# Patient Record
Sex: Male | Born: 1965 | Race: Black or African American | Hispanic: No | Marital: Married | Smoking: Never smoker
Health system: Southern US, Community
[De-identification: ages and names within clinical notes are randomized; demographics above are authoritative.]

## PROBLEM LIST (undated history)

## (undated) DIAGNOSIS — E663 Overweight: Secondary | ICD-10-CM

## (undated) DIAGNOSIS — N3289 Other specified disorders of bladder: Secondary | ICD-10-CM

## (undated) DIAGNOSIS — N189 Chronic kidney disease, unspecified: Secondary | ICD-10-CM

## (undated) DIAGNOSIS — E559 Vitamin D deficiency, unspecified: Secondary | ICD-10-CM

## (undated) DIAGNOSIS — Z9889 Other specified postprocedural states: Secondary | ICD-10-CM

## (undated) DIAGNOSIS — K635 Polyp of colon: Secondary | ICD-10-CM

## (undated) DIAGNOSIS — E785 Hyperlipidemia, unspecified: Secondary | ICD-10-CM

## (undated) DIAGNOSIS — F524 Premature ejaculation: Secondary | ICD-10-CM

## (undated) DIAGNOSIS — N39498 Other specified urinary incontinence: Secondary | ICD-10-CM

## (undated) DIAGNOSIS — N403 Nodular prostate with lower urinary tract symptoms: Secondary | ICD-10-CM

## (undated) HISTORY — DX: Nodular prostate with lower urinary tract symptoms: N40.3

## (undated) HISTORY — DX: Chronic kidney disease, unspecified: N18.9

## (undated) HISTORY — DX: Overweight: E66.3

## (undated) HISTORY — DX: Premature ejaculation: F52.4

## (undated) HISTORY — PX: TONSILLECTOMY: SUR1361

## (undated) HISTORY — DX: Other specified disorders of bladder: N32.89

## (undated) HISTORY — DX: Vitamin D deficiency, unspecified: E55.9

## (undated) HISTORY — DX: Hyperlipidemia, unspecified: E78.5

## (undated) HISTORY — DX: Polyp of colon: K63.5

## (undated) HISTORY — DX: Other specified urinary incontinence: N39.498

---

## 2006-01-09 ENCOUNTER — Ambulatory Visit: Payer: Self-pay | Admitting: Family Medicine

## 2007-02-11 DIAGNOSIS — E785 Hyperlipidemia, unspecified: Secondary | ICD-10-CM | POA: Insufficient documentation

## 2007-03-27 ENCOUNTER — Ambulatory Visit: Payer: Self-pay | Admitting: Gastroenterology

## 2008-12-20 HISTORY — PX: OTHER SURGICAL HISTORY: SHX169

## 2009-08-10 DIAGNOSIS — E559 Vitamin D deficiency, unspecified: Secondary | ICD-10-CM

## 2012-09-11 DIAGNOSIS — Z9889 Other specified postprocedural states: Secondary | ICD-10-CM

## 2012-09-11 HISTORY — DX: Other specified postprocedural states: Z98.890

## 2013-03-27 LAB — HM COLONOSCOPY: HM COLON: NORMAL

## 2014-02-26 LAB — LIPID PANEL
Cholesterol: 248 mg/dL — AB (ref 0–200)
HDL: 57 mg/dL (ref 35–70)
LDL Cholesterol: 171 mg/dL
Triglycerides: 99 mg/dL (ref 40–160)

## 2014-08-26 ENCOUNTER — Ambulatory Visit: Payer: Self-pay

## 2015-02-13 ENCOUNTER — Encounter: Payer: Self-pay | Admitting: Family Medicine

## 2015-02-18 ENCOUNTER — Other Ambulatory Visit: Payer: Self-pay | Admitting: Family Medicine

## 2015-02-18 MED ORDER — FINASTERIDE 5 MG PO TABS: 5.0000 mg | ORAL_TABLET | Freq: Every day | ORAL | Status: DC

## 2015-02-27 ENCOUNTER — Encounter: Payer: Self-pay | Admitting: Family Medicine

## 2015-02-27 DIAGNOSIS — N138 Other obstructive and reflux uropathy: Secondary | ICD-10-CM | POA: Insufficient documentation

## 2015-02-27 DIAGNOSIS — Z87898 Personal history of other specified conditions: Secondary | ICD-10-CM | POA: Insufficient documentation

## 2015-02-27 DIAGNOSIS — N401 Enlarged prostate with lower urinary tract symptoms: Secondary | ICD-10-CM

## 2015-02-27 DIAGNOSIS — R600 Localized edema: Secondary | ICD-10-CM | POA: Insufficient documentation

## 2015-03-01 ENCOUNTER — Ambulatory Visit (INDEPENDENT_AMBULATORY_CARE_PROVIDER_SITE_OTHER): Payer: Managed Care, Other (non HMO) | Admitting: Family Medicine

## 2015-03-01 ENCOUNTER — Encounter: Payer: Self-pay | Admitting: Family Medicine

## 2015-03-01 VITALS — BP 114/82 | HR 89 | Temp 98.3°F | Resp 18 | Ht 71.0 in | Wt 199.7 lb

## 2015-03-01 DIAGNOSIS — Z13220 Encounter for screening for lipoid disorders: Secondary | ICD-10-CM | POA: Diagnosis not present

## 2015-03-01 DIAGNOSIS — Z0000 Encounter for general adult medical examination without abnormal findings: Secondary | ICD-10-CM

## 2015-03-01 DIAGNOSIS — N401 Enlarged prostate with lower urinary tract symptoms: Secondary | ICD-10-CM

## 2015-03-01 DIAGNOSIS — N138 Other obstructive and reflux uropathy: Secondary | ICD-10-CM

## 2015-03-01 DIAGNOSIS — Z860100 Personal history of colon polyps, unspecified: Secondary | ICD-10-CM

## 2015-03-01 NOTE — Progress Notes (Signed)
Name: DEBORAH DONDERO   MRN: [id number]    DOB: [birth year]   Date:03/01/2015       Progress Note  Subjective  Chief Complaint  Chief Complaint  Patient presents with  . Annual Exam    HPI  Well Exam: he has not complaints today, his BPH is under control, had a transurethral incision of bladder neck in April 2016 because he had urinary retention in December and had to use self cath daily until the surgery in April . He is doing well now, back exercising, eating healthy.   Patient Active Problem List   Diagnosis Date Noted  . Benign prostatic hyperplasia with urinary obstruction 02/27/2015  . History of urinary retention 02/27/2015  . Vitamin D deficiency 08/10/2009  . Dyslipidemia 02/11/2007    Past Surgical History  Procedure Laterality Date  . Tonsillectomy    . Transurethral incision of bladder neck Bilateral 12/20/2008    Family History  Problem Relation Age of Onset  . Hypertension Mother   . Hypertension Father     History   Social History  . Marital Status: Married    Spouse Name: N/A  . Number of Children: N/A  . Years of Education: N/A   Occupational History  . Not on file.   Social History Main Topics  . Smoking status: Never Smoker   . Smokeless tobacco: Not on file  . Alcohol Use: No  . Drug Use: No  . Sexual Activity:    Partners: Female   Other Topics Concern  . Not on file   Social History Narrative     Current outpatient prescriptions:  .  Cholecalciferol (VITAMIN D) 2000 UNITS tablet, Take by mouth., Disp: , Rfl:  .  finasteride (PROSCAR) 5 MG tablet, Take by mouth., Disp: , Rfl:  .  tamsulosin (FLOMAX) 0.4 MG CAPS capsule, Take by mouth., Disp: , Rfl:   No Known Allergies   ROS  Constitutional: Negative for fever or weight change.  Respiratory: Negative for cough and shortness of breath.   Cardiovascular: Negative for chest pain or palpitations.  Gastrointestinal: Negative for abdominal pain, no bowel changes.  Musculoskeletal:  Negative for gait problem or joint swelling.  Skin: Negative for rash.  Neurological: Negative for dizziness or headache.  No other specific complaints in a complete review of systems (except as listed in HPI above).   Objective  Filed Vitals:   03/01/15 0844  BP: 114/82  Pulse: 89  Temp: 98.3 F (36.8 C)  TempSrc: Oral  Resp: 18  Height:  (1.803 m)  Weight: 199 lb 11.2 oz (90.583 kg)  SpO2: 97%    Body mass index is 27.86 kg/(m^2).  Physical Exam  Constitutional: Patient appears well-developed and well-nourished. No distress.  HENT: Head: Normocephalic and atraumatic. Ears: B TMs ok, no erythema or effusion; Nose: Nose normal. Mouth/Throat: Oropharynx is clear and moist. No oropharyngeal exudate.  Eyes: Conjunctivae and EOM are normal. Pupils are equal, round, and reactive to light. No scleral icterus.  Neck: Normal range of motion. Neck supple. No JVD present. No thyromegaly present.  Cardiovascular: Normal rate, regular rhythm and normal heart sounds.  No murmur heard. No BLE edema. Pulmonary/Chest: Effort normal and breath sounds normal. No respiratory distress. Abdominal: Soft. Bowel sounds are normal, no distension. There is no tenderness. no masses MALE GENITALIA: not done, seen by Urologist 2 months ago RECTAL: done by Urologist 2 months ago Musculoskeletal: Normal range of motion, no joint effusions. No gross deformities Neurological: he  is alert and oriented to person, place, and time. No cranial nerve deficit. Coordination, balance, strength, speech and gait are normal.  Skin: Skin is warm and dry. No rash noted. No erythema.  Psychiatric: Patient has a normal mood and affect. behavior is normal. Judgment and thought content normal.  PHQ2/9: Depression screen PHQ 2/9 03/01/2015  Decreased Interest 0  Down, Depressed, Hopeless 0  PHQ - 2 Score 0     Fall Risk: Fall Risk  03/01/2015  Falls in the past year? No    Assessment & Plan  1. Encounter for  routine history and physical exam for male Discussed importance of 150 minutes of physical activity weekly, eat two servings of fish weekly, eat one serving of tree nuts ( cashews, pistachios, pecans, almonds.Marland Kitchen) every other day, eat 6 servings of fruit/vegetables daily and drink plenty of water and avoid sweet beverages. Start aspirin 81 mg when you turn 50yo  2. History of colon polyps Last one 03/27/2013 was normal but due for repeat every 3 years ( previous one in 2011 had polyp) no family history of colon cancer  3. Benign prostatic hyperplasia with urinary obstruction Continue medicaitons  4. Lipid screening

## 2015-03-01 NOTE — Patient Instructions (Signed)
Discussed importance of 150 minutes of physical activity weekly, eat two servings of fish weekly, eat one serving of tree nuts ( cashews, pistachios, pecans, almonds.Marland Kitchen) every other day, eat 6 servings of fruit/vegetables daily and drink plenty of water and avoid sweet beverages. Start aspirin 81 mg when you turn 50yo

## 2015-03-02 ENCOUNTER — Telehealth: Payer: Self-pay

## 2015-03-02 LAB — LIPID PANEL
CHOL/HDL RATIO: 4.4 ratio (ref 0.0–5.0)
Cholesterol, Total: 242 mg/dL — ABNORMAL HIGH (ref 100–199)
HDL: 55 mg/dL (ref >39)
LDL Calculated: 169 mg/dL — ABNORMAL HIGH (ref 0–99)
Triglycerides: 88 mg/dL (ref 0–149)
VLDL CHOLESTEROL CAL: 18 mg/dL (ref 5–40)

## 2015-03-02 NOTE — Telephone Encounter (Signed)
-----   Message from Alba Cory, MD sent at 03/02/2015  8:18 AM EDT ----- Based on the results of lipid panel his cardiovascular risk factor in the next 10 years is 4.2% and he does not need to take statins at this time. He needs to follow a low fat diet and exercise

## 2015-03-02 NOTE — Telephone Encounter (Signed)
Left vm for patient to return my call. °

## 2015-08-26 ENCOUNTER — Encounter: Payer: Self-pay | Admitting: Family Medicine

## 2015-08-27 ENCOUNTER — Other Ambulatory Visit: Payer: Self-pay

## 2015-08-27 MED ORDER — TAMSULOSIN HCL 0.4 MG PO CAPS: 0.4000 mg | ORAL_CAPSULE | Freq: Every day | ORAL | Status: DC

## 2015-11-28 ENCOUNTER — Other Ambulatory Visit: Payer: Self-pay | Admitting: Family Medicine

## 2016-03-01 ENCOUNTER — Ambulatory Visit (INDEPENDENT_AMBULATORY_CARE_PROVIDER_SITE_OTHER): Payer: Managed Care, Other (non HMO) | Admitting: Family Medicine

## 2016-03-01 ENCOUNTER — Encounter: Payer: Self-pay | Admitting: Family Medicine

## 2016-03-01 VITALS — BP 118/74 | HR 78 | Temp 98.0°F | Resp 18 | Ht 71.0 in | Wt 174.9 lb

## 2016-03-01 DIAGNOSIS — Z0000 Encounter for general adult medical examination without abnormal findings: Secondary | ICD-10-CM

## 2016-03-01 DIAGNOSIS — R634 Abnormal weight loss: Secondary | ICD-10-CM | POA: Diagnosis not present

## 2016-03-01 DIAGNOSIS — Z1211 Encounter for screening for malignant neoplasm of colon: Secondary | ICD-10-CM

## 2016-03-01 DIAGNOSIS — E559 Vitamin D deficiency, unspecified: Secondary | ICD-10-CM

## 2016-03-01 DIAGNOSIS — Z13220 Encounter for screening for lipoid disorders: Secondary | ICD-10-CM | POA: Diagnosis not present

## 2016-03-01 DIAGNOSIS — N401 Enlarged prostate with lower urinary tract symptoms: Secondary | ICD-10-CM

## 2016-03-01 DIAGNOSIS — Z86010 Personal history of colonic polyps: Secondary | ICD-10-CM | POA: Diagnosis not present

## 2016-03-01 DIAGNOSIS — N138 Other obstructive and reflux uropathy: Secondary | ICD-10-CM

## 2016-03-01 DIAGNOSIS — Z9289 Personal history of other medical treatment: Secondary | ICD-10-CM

## 2016-03-01 DIAGNOSIS — Z131 Encounter for screening for diabetes mellitus: Secondary | ICD-10-CM

## 2016-03-01 DIAGNOSIS — Z79899 Other long term (current) drug therapy: Secondary | ICD-10-CM

## 2016-03-01 LAB — COMPREHENSIVE METABOLIC PANEL
ALBUMIN: 3.9 g/dL (ref 3.6–5.1)
ALT: 16 U/L (ref 9–46)
AST: 16 U/L (ref 10–35)
Alkaline Phosphatase: 67 U/L (ref 40–115)
BUN: 24 mg/dL (ref 7–25)
CHLORIDE: 103 mmol/L (ref 98–110)
CO2: 28 mmol/L (ref 20–31)
CREATININE: 1.45 mg/dL — AB (ref 0.70–1.33)
Calcium: 9.5 mg/dL (ref 8.6–10.3)
Glucose, Bld: 92 mg/dL (ref 65–99)
POTASSIUM: 4.3 mmol/L (ref 3.5–5.3)
SODIUM: 139 mmol/L (ref 135–146)
TOTAL PROTEIN: 7.1 g/dL (ref 6.1–8.1)
Total Bilirubin: 0.5 mg/dL (ref 0.2–1.2)

## 2016-03-01 LAB — LIPID PANEL
CHOL/HDL RATIO: 3.3 ratio (ref <=5.0)
Cholesterol: 191 mg/dL (ref 125–200)
HDL: 58 mg/dL (ref >=40)
LDL CALC: 123 mg/dL (ref <130)
Triglycerides: 49 mg/dL (ref <150)
VLDL: 10 mg/dL (ref <30)

## 2016-03-01 LAB — TSH: TSH: 1.17 m[IU]/L (ref 0.40–4.50)

## 2016-03-01 MED ORDER — TAMSULOSIN HCL 0.4 MG PO CAPS: 0.4000 mg | ORAL_CAPSULE | Freq: Every day | ORAL | Status: DC

## 2016-03-01 MED ORDER — ASPIRIN EC 81 MG PO TBEC: 81.0000 mg | DELAYED_RELEASE_TABLET | Freq: Every day | ORAL | Status: DC

## 2016-03-01 MED ORDER — FINASTERIDE 5 MG PO TABS: 5.0000 mg | ORAL_TABLET | Freq: Every day | ORAL | Status: DC

## 2016-03-01 NOTE — Progress Notes (Addendum)
Name: Mitchell Doyle   MRN: [id number]    DOB: 1966/06/02   Date:03/01/2016       Progress Note  Subjective  Chief Complaint  Chief Complaint  Patient presents with  . Annual Exam    HPI  Well Male Exam: he has lost 26 lbs since last year, he has not been drinking sweet beverages for the past 10 years, and no sweets for the past one year, he is also exercising 3-4 times weekly. Cardiovascular and weights. Feeling well, he denies fatigue, no episodes of urinary retention since last Spring. He states that he has been stable for past 4-5 months  BPH with history of urinary retention: last year he had severe urinary retention, went to [facility] had a catheter placed for 4 months, and after he saw Dr. Lennie Odor he had a transurethral incision of bladder neck 12/21/2014, and he has been on Proscar and Flomax and is doing well.   Weight loss: he has radically changed his diet and his life style, but has noticed some cold intolerance, weight has been stable over the past 4-6 months. We will check labs, but he does not want to have HIV checked   Patient Active Problem List   Diagnosis Date Noted  . Benign prostatic hyperplasia with urinary obstruction 02/27/2015  . History of urinary retention 02/27/2015  . Vitamin D deficiency 08/10/2009  . Dyslipidemia 02/11/2007    Past Surgical History  Procedure Laterality Date  . Tonsillectomy    . Transurethral incision of bladder neck Bilateral 12/20/2008    Family History  Problem Relation Age of Onset  . Hypertension Mother   . Hypertension Father     Social History   Social History  . Marital Status: Married    Spouse Name: N/A  . Number of Children: N/A  . Years of Education: N/A   Occupational History  . Not on file.   Social History Main Topics  . Smoking status: Never Smoker   . Smokeless tobacco: Never Used  . Alcohol Use: No  . Drug Use: No  . Sexual Activity:    Partners: Female   Other Topics Concern  . Not on file    Social History Narrative     Current outpatient prescriptions:  .  Cholecalciferol (VITAMIN D) 2000 UNITS tablet, Take by mouth., Disp: , Rfl:  .  finasteride (PROSCAR) 5 MG tablet, Take 1 tablet (5 mg total) by mouth daily., Disp: 90 tablet, Rfl: 3 .  tamsulosin (FLOMAX) 0.4 MG CAPS capsule, Take 1 capsule (0.4 mg total) by mouth daily., Disp: 90 capsule, Rfl: 3 .  Vitamin D, Ergocalciferol, (DRISDOL) 50000 units CAPS capsule, Take by mouth., Disp: , Rfl:  .  aspirin EC 81 MG tablet, Take 1 tablet (81 mg total) by mouth daily., Disp: 30 tablet, Rfl: 0  No Known Allergies   ROS  Constitutional: Negative for fever , positive for  weight change.  Respiratory: Negative for cough and shortness of breath.   Cardiovascular: Negative for chest pain or palpitations.  Gastrointestinal: Negative for abdominal pain, no bowel changes.  Musculoskeletal: Negative for gait problem or joint swelling.  Skin: Negative for rash.  Neurological: Negative for dizziness or headache.  No other specific complaints in a complete review of systems (except as listed in HPI above).  Objective  Filed Vitals:   03/01/16 0835  BP: 118/74  Pulse: 78  Temp: 98 F (36.7 C)  TempSrc: Oral  Resp: 18  Height:  (1.803 m)  Weight: 174 lb 14.4 oz (79.334 kg)  SpO2: 95%    Body mass index is 24.4 kg/(m^2).  Physical Exam  Constitutional: Patient appears well-developed and well-nourished. No distress.  HENT: Head: Normocephalic and atraumatic. Ears: B TMs ok, no erythema or effusion; Nose: Nose normal. Mouth/Throat: Oropharynx is clear and moist. No oropharyngeal exudate.  Eyes: Conjunctivae and EOM are normal. Pupils are equal, round, and reactive to light. No scleral icterus.  Neck: Normal range of motion. Neck supple. No JVD present. No thyromegaly present.  Cardiovascular: Normal rate, regular rhythm and normal heart sounds.  No murmur heard. No BLE edema. Pulmonary/Chest: Effort normal and  breath sounds normal. No respiratory distress. Abdominal: Soft. Bowel sounds are normal, no distension. There is no tenderness. no masses MALE GENITALIA: Normal descended testes bilaterally, no masses palpated, no hernias, no lesions, no discharge RECTAL: Prostate enlarged, no masses, no rectal masses or hemorrhoids Musculoskeletal: Normal range of motion, no joint effusions. No gross deformities Neurological: he is alert and oriented to person, place, and time. No cranial nerve deficit. Coordination, balance, strength, speech and gait are normal.  Skin: Skin is warm and dry. No rash noted. No erythema.  Psychiatric: Patient has a normal mood and affect. behavior is normal. Judgment and thought content normal.   PHQ2/9: Depression screen [facility] 2/9 03/01/2016 03/01/2015  Decreased Interest 0 0  Down, Depressed, Hopeless 0 0  PHQ - 2 Score 0 0    Fall Risk: Fall Risk  03/01/2016 03/01/2015  Falls in the past year? No No    Functional Status Survey: Is the patient deaf or have difficulty hearing?: No Does the patient have difficulty seeing, even when wearing glasses/contacts?: No Does the patient have difficulty concentrating, remembering, or making decisions?: No Does the patient have difficulty walking or climbing stairs?: No Does the patient have difficulty dressing or bathing?: No Does the patient have difficulty doing errands alone such as visiting a doctor's office or shopping?: No  IPSS Questionnaire (AUA-7): Over the past month.   1)  How often have you had a sensation of not emptying your bladder completely after you finish urinating?  0 - Not at all  2)  How often have you had to urinate again less than two hours after you finished urinating? 0 - Not at all  3)  How often have you found you stopped and started again several times when you urinated?  0 - Not at all  4) How difficult have you found it to postpone urination?  0 - Not at all  5) How often have you had a weak urinary  stream?  0 - Not at all  6) How often have you had to push or strain to begin urination?  0 - Not at all  7) How many times did you most typically get up to urinate from the time you went to bed until the time you got up in the morning?  1 - 1 time  Total score:  0-7 mildly symptomatic   8-19 moderately symptomatic   20-35 severely symptomatic   Assessment & Plan  1. Encounter for routine history and physical exam for male  Discussed importance of 150 minutes of physical activity weekly, eat two servings of fish weekly, eat one serving of tree nuts ( cashews, pistachios, pecans, almonds.Marland Kitchen.) every other day, eat 6 servings of fruit/vegetables daily and drink plenty of water and avoid sweet beverages. Start aspirin 81 mg daily   2. History of colon polyps  History of polyps in the past  3. Benign prostatic hyperplasia with urinary obstruction  - tamsulosin (FLOMAX) 0.4 MG CAPS capsule; Take 1 capsule (0.4 mg total) by mouth daily.  Dispense: 90 capsule; Refill: 3 - finasteride (PROSCAR) 5 MG tablet; Take 1 tablet (5 mg total) by mouth daily.  Dispense: 90 tablet; Refill: 3 - PSA  4. Lipid screening  - Lipid panel  5. Vitamin D deficiency  - VITAMIN D 25 Hydroxy (Vit-D Deficiency, Fractures)  6. Encounter for screening examination for impaired glucose regulation and diabetes mellitus  - Hemoglobin A1c  7. Hx of long-term treatment with high-risk medication  - Comprehensive metabolic panel  8. Colon cancer screening  - Ambulatory referral to Gastroenterology

## 2016-03-01 NOTE — Addendum Note (Signed)
Addended by: Alba CorySOWLES, Lillan Mccreadie F on: 03/01/2016 09:10 AM   Modules accepted: Orders, SmartSet

## 2016-03-02 LAB — PSA: PSA: 0.82 ng/mL (ref <=4.00)

## 2016-03-02 LAB — CBC WITH DIFFERENTIAL/PLATELET
BASOS ABS: 83 {cells}/uL (ref 0–200)
BASOS PCT: 1 %
EOS PCT: 2 %
Eosinophils Absolute: 166 cells/uL (ref 15–500)
HCT: 41.8 % (ref 38.5–50.0)
HEMOGLOBIN: 13.4 g/dL (ref 13.2–17.1)
LYMPHS ABS: 2158 {cells}/uL (ref 850–3900)
Lymphocytes Relative: 26 %
MCH: 27 pg (ref 27.0–33.0)
MCHC: 32.1 g/dL (ref 32.0–36.0)
MCV: 84.1 fL (ref 80.0–100.0)
MONOS PCT: 6 %
MPV: 9.9 fL (ref 7.5–12.5)
Monocytes Absolute: 498 cells/uL (ref 200–950)
NEUTROS ABS: 5395 {cells}/uL (ref 1500–7800)
Neutrophils Relative %: 65 %
PLATELETS: 333 10*3/uL (ref 140–400)
RBC: 4.97 MIL/uL (ref 4.20–5.80)
RDW: 15.7 % — ABNORMAL HIGH (ref 11.0–15.0)
WBC: 8.3 10*3/uL (ref 3.8–10.8)

## 2016-03-02 LAB — HEMOGLOBIN A1C
HEMOGLOBIN A1C: 5.3 % (ref <5.7)
MEAN PLASMA GLUCOSE: 105 mg/dL

## 2016-03-02 LAB — VITAMIN D 25 HYDROXY (VIT D DEFICIENCY, FRACTURES): Vit D, 25-Hydroxy: 52 ng/mL (ref 30–100)

## 2016-03-08 ENCOUNTER — Other Ambulatory Visit: Payer: Self-pay

## 2016-08-31 ENCOUNTER — Encounter: Payer: Self-pay | Admitting: Family Medicine

## 2016-08-31 ENCOUNTER — Ambulatory Visit (INDEPENDENT_AMBULATORY_CARE_PROVIDER_SITE_OTHER): Payer: Managed Care, Other (non HMO) | Admitting: Family Medicine

## 2016-08-31 VITALS — BP 120/84 | HR 102 | Temp 99.0°F | Resp 18 | Ht 71.0 in | Wt 173.2 lb

## 2016-08-31 DIAGNOSIS — R05 Cough: Secondary | ICD-10-CM | POA: Diagnosis not present

## 2016-08-31 DIAGNOSIS — R059 Cough, unspecified: Secondary | ICD-10-CM

## 2016-08-31 DIAGNOSIS — Z23 Encounter for immunization: Secondary | ICD-10-CM

## 2016-08-31 MED ORDER — FLUTICASONE FUROATE-VILANTEROL 100-25 MCG/INH IN AEPB: 1.0000 | INHALATION_SPRAY | Freq: Every day | RESPIRATORY_TRACT | 0 refills | Status: DC

## 2016-08-31 MED ORDER — FLUTICASONE PROPIONATE 50 MCG/ACT NA SUSP: 2.0000 | Freq: Every day | NASAL | 0 refills | Status: DC

## 2016-08-31 MED ORDER — AZITHROMYCIN 250 MG PO TABS: ORAL_TABLET | ORAL | 0 refills | Status: DC

## 2016-08-31 MED ORDER — LORATADINE 10 MG PO TABS: 10.0000 mg | ORAL_TABLET | Freq: Every day | ORAL | 0 refills | Status: DC

## 2016-08-31 NOTE — Progress Notes (Signed)
Name: Mitchell Doyle   MRN: [id number]    DOB: Aug 12, 1966   Date:08/31/2016       Progress Note  Subjective  Chief Complaint  Chief Complaint  Patient presents with  . Cough    Dry Cough for the past month, has tried several otc remedies with no success. Has tried Theraflu, halls, cough drops. Patient states it is worst when talking alot and laying down.     HPI  URI: he [place] Started with rhinorrhea, congestion, muscle aches, followed by a cough about one week after initial symptoms. He has tried multiple otc mediations. He is getting tired of coughing. Worse at night. No wheezing or SOB. He is not a smoker and he does not have a history of asthma or allergies. He continues to have clear rhinorrhea. No fever or chills. Appetite was decreased but is improving now.    Patient Active Problem List   Diagnosis Date Noted  . Benign prostatic hyperplasia with urinary obstruction 02/27/2015  . History of urinary retention 02/27/2015  . Vitamin D deficiency 08/10/2009  . Dyslipidemia 02/11/2007    Past Surgical History:  Procedure Laterality Date  . TONSILLECTOMY    . transurethral incision of bladder neck Bilateral 12/20/2008    Family History  Problem Relation Age of Onset  . Hypertension Mother   . Hypertension Father     Social History   Social History  . Marital status: Married    Spouse name: N/A  . Number of children: N/A  . Years of education: N/A   Occupational History  . Not on file.   Social History Main Topics  . Smoking status: Never Smoker  . Smokeless tobacco: Never Used  . Alcohol use No  . Drug use: No  . Sexual activity: Yes    Partners: Female   Other Topics Concern  . Not on file   Social History Narrative  . No narrative on file     Current Outpatient Prescriptions:  .  Cholecalciferol (VITAMIN D) 2000 UNITS tablet, Take by mouth., Disp: , Rfl:  .  finasteride (PROSCAR) 5  MG tablet, Take 1 tablet (5 mg total) by mouth daily., Disp: 90 tablet, Rfl: 3 .  tamsulosin (FLOMAX) 0.4 MG CAPS capsule, Take 1 capsule (0.4 mg total) by mouth daily., Disp: 90 capsule, Rfl: 3 .  aspirin EC 81 MG tablet, Take 1 tablet (81 mg total) by mouth daily. (Patient not taking: Reported on 08/31/2016), Disp: 30 tablet, Rfl: 0 .  azithromycin (ZITHROMAX) 250 MG tablet, Take as directed 2 first day and one daily after that, Disp: 6 each, Rfl: 0 .  fluticasone (FLONASE) 50 MCG/ACT nasal spray, Place 2 sprays into both nostrils daily., Disp: 16 g, Rfl: 0 .  fluticasone furoate-vilanterol (BREO ELLIPTA) 100-25 MCG/INH AEPB, Inhale 1 puff into the lungs daily., Disp: 60 each, Rfl: 0 .  loratadine (CLARITIN) 10 MG tablet, Take 1 tablet (10 mg total) by mouth daily., Disp: 30 tablet, Rfl: 0 .  Vitamin D, Ergocalciferol, (DRISDOL) 50000 units CAPS capsule, Take by mouth., Disp: , Rfl:   No Known Allergies   ROS  Constitutional: Negative for fever or weight change.  Respiratory: Positive  for cough no  shortness of breath.   Cardiovascular: Negative for chest pain or palpitations.  Gastrointestinal: Negative for abdominal pain, no bowel changes.  Musculoskeletal: Negative for gait problem or joint swelling.  Skin: Negative for rash.  Neurological: Negative for dizziness or headache.  No other specific complaints in a complete review of systems (except as listed in HPI above).   Objective  Vitals:   08/31/16 1209  BP: 120/84  Pulse: (!) 102  Resp: 18  Temp: 99 F (37.2 C)  TempSrc: Oral  SpO2: 96%  Weight: 173 lb 3.2 oz (78.6 kg)  Height: 5\' 11"  (1.803 m)    Body mass index is 24.16 kg/m.  Physical Exam  Constitutional: Patient appears well-developed and well-nourished. No distress.  HEENT: head atraumatic, normocephalic, pupils equal and reactive to light, neck supple, throat within normal limits Cardiovascular: Normal rate, regular rhythm and normal heart sounds.  No  murmur heard. No BLE edema. Pulmonary/Chest: Effort normal and breath sounds normal. No respiratory distress. Abdominal: Soft.  There is no tenderness. Psychiatric: Patient has a normal mood and affect. behavior is normal. Judgment and thought content normal.  PHQ2/9: [facility] 08/31/2016 03/01/2016 03/01/2015  Decreased Interest 0 0 0  Down, Depressed, Hopeless 0 0 0  PHQ - 2 Score 0 0 0    Fall Risk: Fall Risk  08/31/2016 03/01/2016 03/01/2015  Falls in the past year? No No No    Functional Status Survey: Is the patient deaf or have difficulty hearing?: No Does the patient have difficulty seeing, even when wearing glasses/contacts?: No Does the patient have difficulty concentrating, remembering, or making decisions?: No Does the patient have difficulty walking or climbing stairs?: No Does the patient have difficulty dressing or bathing?: No Does the patient have difficulty doing errands alone such as visiting a doctor's office or shopping?: No    Assessment & Plan  1. Cough  Discussed it may be post-bronchial cough versus allergies, however because of recent travel we will also treat for whooping cough.  - azithromycin (ZITHROMAX) 250 MG tablet; Take as directed 2 first day and one daily after that  Dispense: 6 each; Refill: 0 - fluticasone furoate-vilanterol (BREO ELLIPTA) 100-25 MCG/INH AEPB; Inhale 1 puff into the lungs daily.  Dispense: 60 each; Refill: 0 - loratadine (CLARITIN) 10 MG tablet; Take 1 tablet (10 mg total) by mouth daily.  Dispense: 30 tablet; Refill: 0 - fluticasone (FLONASE) 50 MCG/ACT nasal spray; Place 2 sprays into both nostrils daily.  Dispense: 16 g; Refill: 0  2. Needs flu shot  refused

## 2016-11-10 IMAGING — US US RENAL KIDNEY
1 series · 14 of 25 positions shown · non-contrast
Comparison: None.

CLINICAL DATA: BPH, urinary retention

EXAM:
RENAL/URINARY TRACT ULTRASOUND COMPLETE

[Series 1: us renal kidney · 0.28mm/px · 14 of 35 slices shown]
[im 1/35]
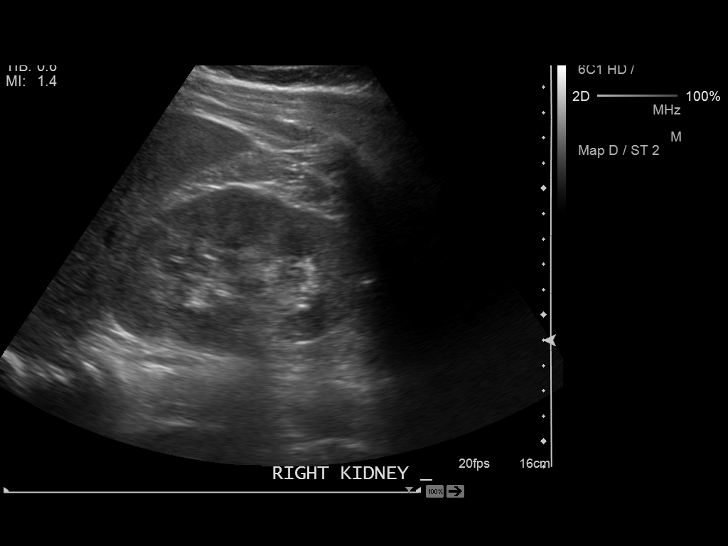
[im 3/35]
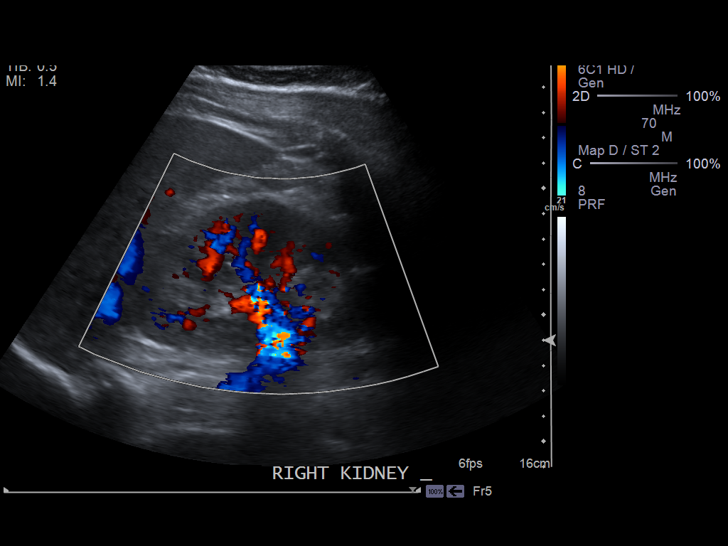
[im 6/35]
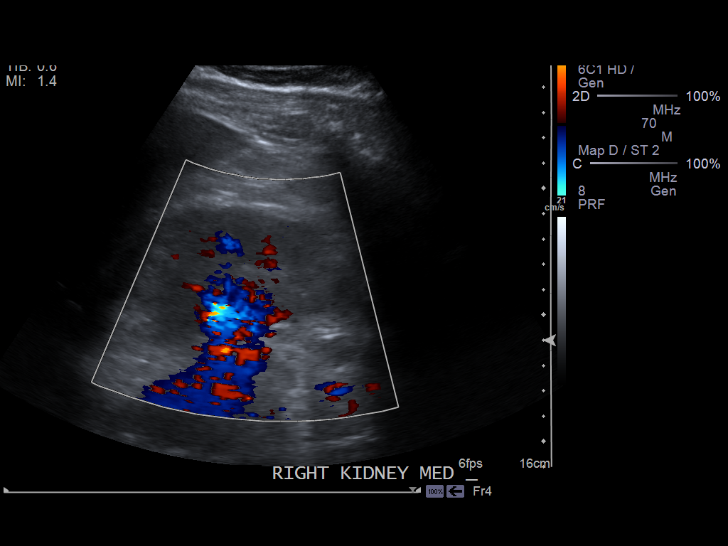
[im 9/35]
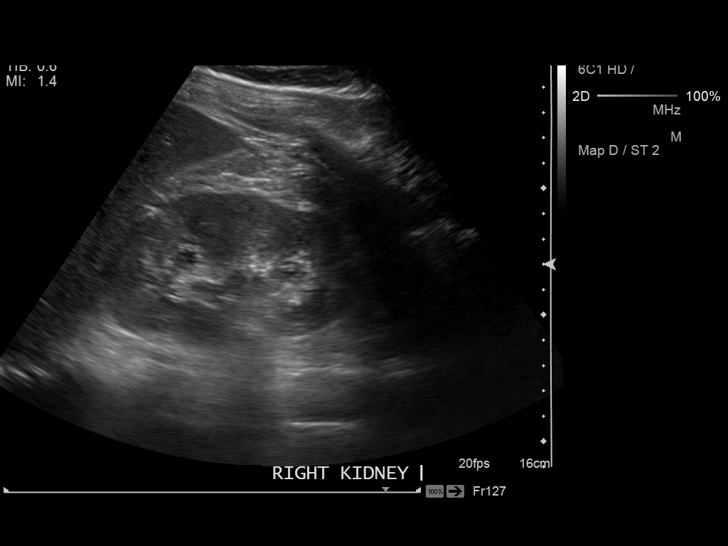
[im 12/35]
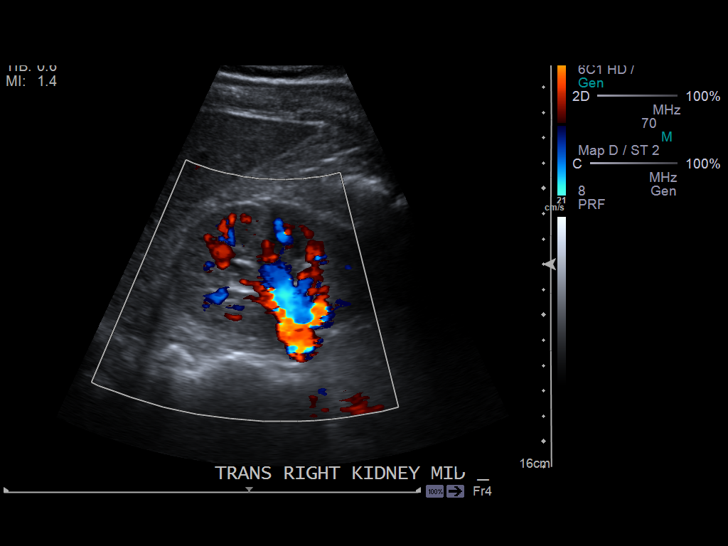
[im 13/35]
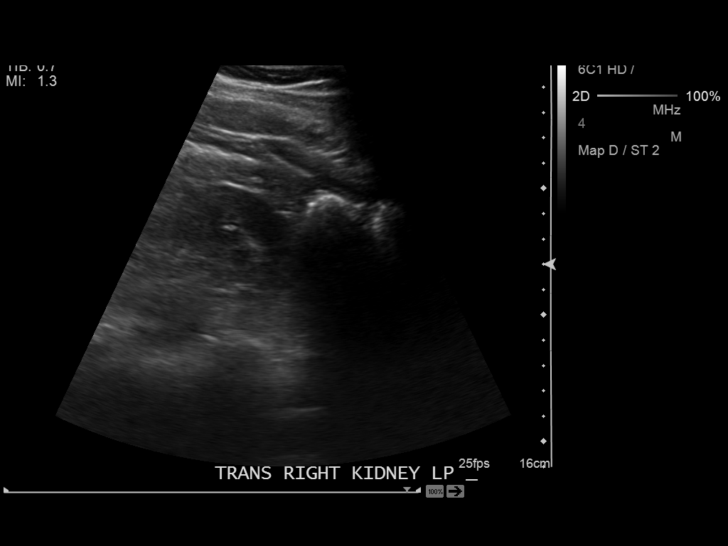
[im 16/35]
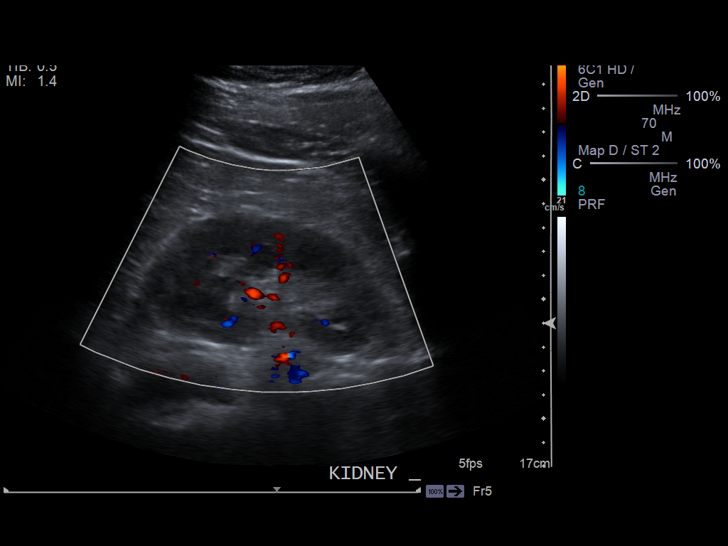
[im 19/35]
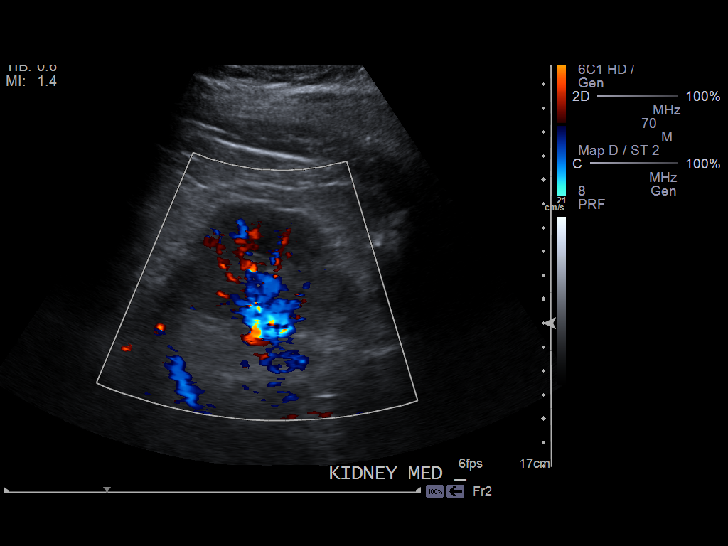
[im 22/35]
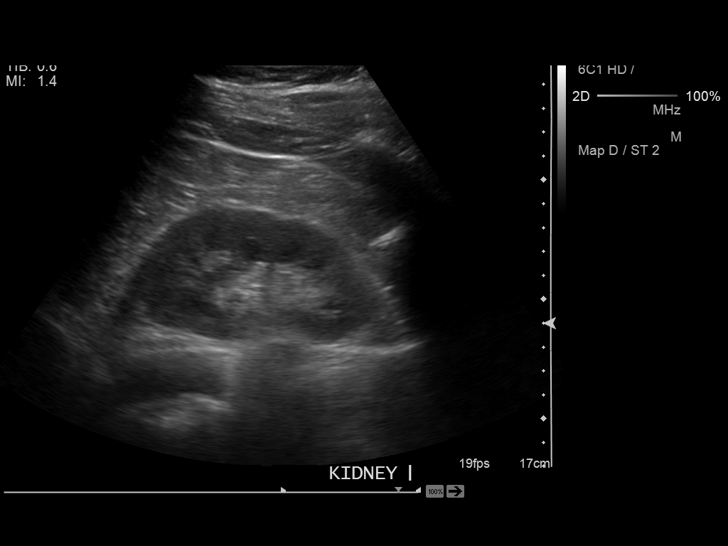
[im 23/35]
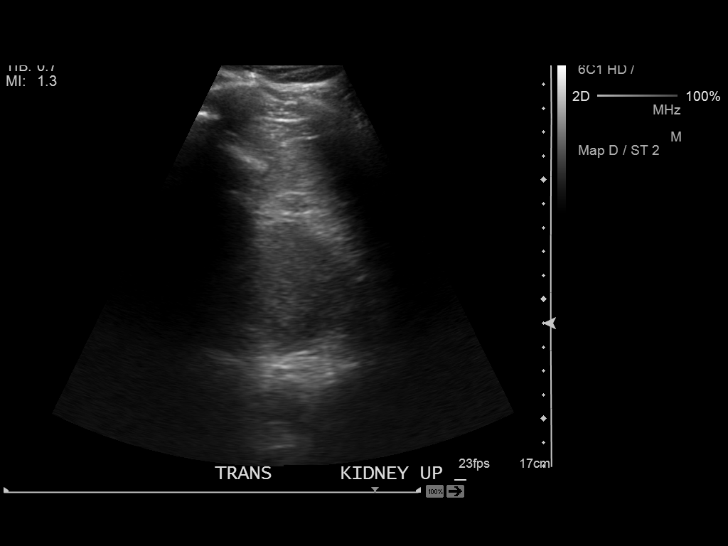
[im 26/35]
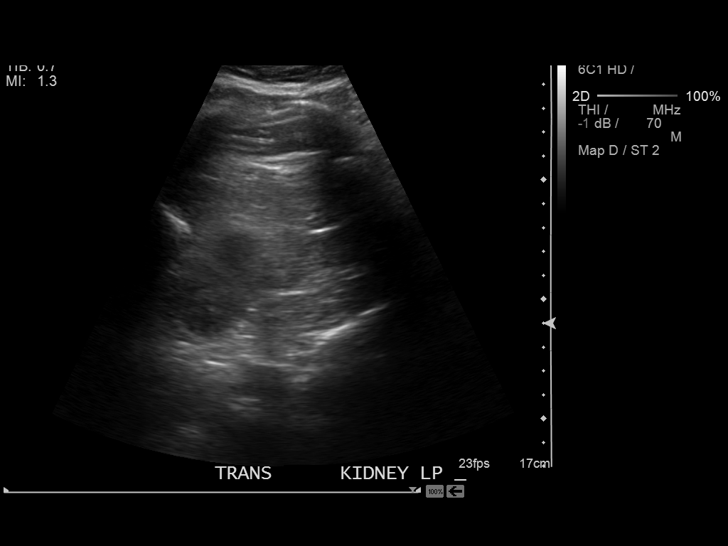
[im 29/35]
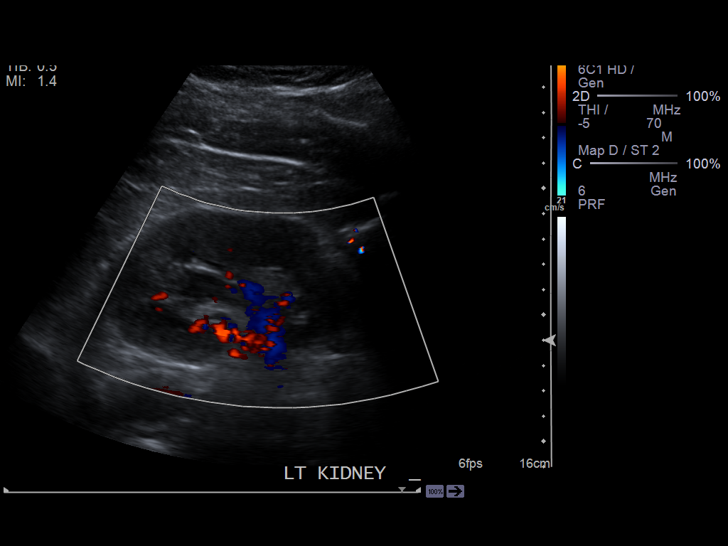
[im 32/35]
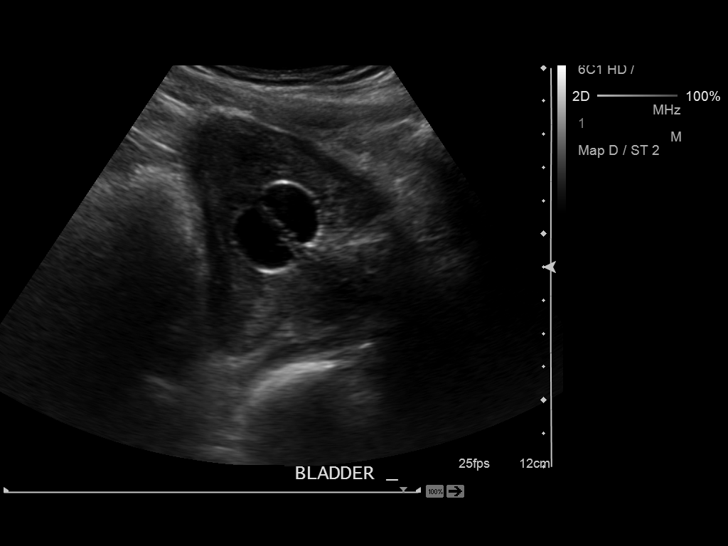
[im 35/35]
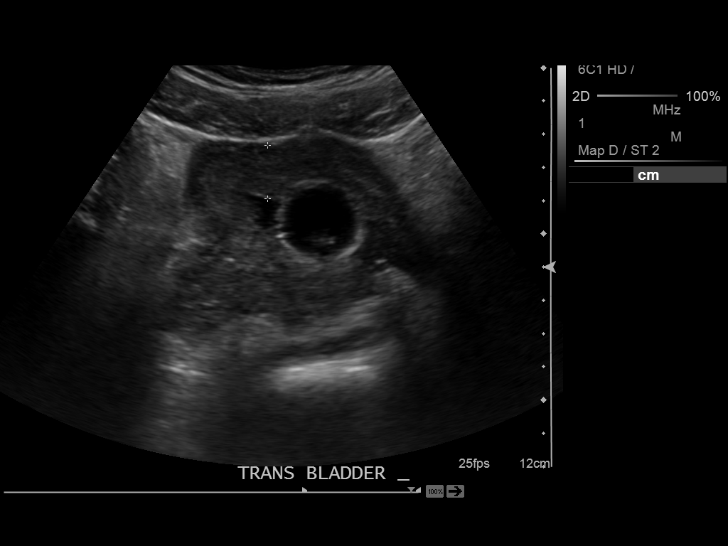

[14 of 25 positions shown; findings below may reference images not displayed]

FINDINGS: Right Kidney:

Length: 9.8 cm. Echogenicity within normal limits. No mass or
hydronephrosis visualized.

Left Kidney:

Length: 9.9 cm. Normal echotexture. Slight pelvicaliectasis. No
mass.

Bladder:

Decompressed with Foley catheter in place. Bladder wall appears
thickened measuring 16 mm, but difficult to evaluate due to
nondistention.
IMPRESSION: Slight left pelvicaliectasis.

Decompressed bladder. Bladder wall appears thickened but difficult
to evaluate.

## 2017-01-24 ENCOUNTER — Telehealth: Payer: Self-pay

## 2017-01-24 ENCOUNTER — Telehealth: Payer: Self-pay | Admitting: Gastroenterology

## 2017-01-24 ENCOUNTER — Other Ambulatory Visit: Payer: Self-pay

## 2017-01-24 DIAGNOSIS — Z86010 Personal history of colonic polyps: Secondary | ICD-10-CM

## 2017-01-24 DIAGNOSIS — Z1211 Encounter for screening for malignant neoplasm of colon: Secondary | ICD-10-CM

## 2017-01-24 NOTE — Telephone Encounter (Signed)
colonoscopy

## 2017-01-24 NOTE — Telephone Encounter (Signed)
Call returned lvm to contact office to schedule.

## 2017-01-25 ENCOUNTER — Telehealth: Payer: Self-pay | Admitting: Gastroenterology

## 2017-01-25 NOTE — Telephone Encounter (Signed)
Gastroenterology Pre-Procedure Review  Request Date: 02/02/17 Requesting Physician: [facility]  PATIENT REVIEW QUESTIONS: The patient responded to the following health history questions as indicated:    1. Are you having any GI issues? no 2. Do you have a personal history of Polyps? yes (according to dr. Carlynn Purlsowles notes yes ) 3. Do you have a family history of Colon Cancer or Polyps? no 4. Diabetes Mellitus? no 5. Joint replacements in the past 12 months?no 6. Major health problems in the past 3 months?no 7. Any artificial heart valves, MVP, or defibrillator?no    MEDICATIONS & ALLERGIES:    Patient reports the following regarding taking any anticoagulation/antiplatelet therapy:   Plavix, Coumadin, Eliquis, Xarelto, Lovenox, Pradaxa, Brilinta, or Effient? no Aspirin? no  Patient confirms/reports the following medications:  Current Outpatient Prescriptions  Medication Sig Dispense Refill  . aspirin EC 81 MG tablet Take 1 tablet (81 mg total) by mouth daily. (Patient not taking: Reported on 08/31/2016) 30 tablet 0  . azithromycin (ZITHROMAX) 250 MG tablet Take as directed 2 first day and one daily after that 6 each 0  . Cholecalciferol (VITAMIN D) 2000 UNITS tablet Take by mouth.    . finasteride (PROSCAR) 5 MG tablet Take 1 tablet (5 mg total) by mouth daily. 90 tablet 3  . fluticasone (FLONASE) 50 MCG/ACT nasal spray Place 2 sprays into both nostrils daily. 16 g 0  . fluticasone furoate-vilanterol (BREO ELLIPTA) 100-25 MCG/INH AEPB Inhale 1 puff into the lungs daily. 60 each 0  . loratadine (CLARITIN) 10 MG tablet Take 1 tablet (10 mg total) by mouth daily. 30 tablet 0  . tamsulosin (FLOMAX) 0.4 MG CAPS capsule Take 1 capsule (0.4 mg total) by mouth daily. 90 capsule 3  . Vitamin D, Ergocalciferol, (DRISDOL) 50000 units CAPS capsule Take by mouth.     No current facility-administered medications for this visit.     Patient confirms/reports the following allergies:  No Known  Allergies  No orders of the defined types were placed in this encounter.   AUTHORIZATION INFORMATION Primary Insurance: 1D#: Group #:  Secondary Insurance: 1D#: Group #:  SCHEDULE INFORMATION: Date: 02/02/17 Dr. Servando SnareWohl Time: Location:

## 2017-01-25 NOTE — Telephone Encounter (Signed)
01/25/17 [employer] automated system NO prior auth required for Colonoscopy [id number] / Z86.10 Conf #: [id number].

## 2017-02-01 NOTE — Discharge Instructions (Signed)

## 2017-02-02 ENCOUNTER — Ambulatory Visit
Admission: RE | Admit: 2017-02-02 | Discharge: 2017-02-02 | Disposition: A | Discharge status: Home / Self Care | Payer: Managed Care, Other (non HMO) | Source: Ambulatory Visit | Attending: Gastroenterology | Admitting: Gastroenterology

## 2017-02-02 ENCOUNTER — Ambulatory Visit: Payer: Managed Care, Other (non HMO) | Admitting: Anesthesiology

## 2017-02-02 ENCOUNTER — Encounter
Admission: RE | Disposition: A | Discharge status: Home / Self Care | Payer: Self-pay | Source: Ambulatory Visit | Attending: Gastroenterology

## 2017-02-02 DIAGNOSIS — Z79899 Other long term (current) drug therapy: Secondary | ICD-10-CM | POA: Insufficient documentation

## 2017-02-02 DIAGNOSIS — Z6825 Body mass index (BMI) 25.0-25.9, adult: Secondary | ICD-10-CM | POA: Insufficient documentation

## 2017-02-02 DIAGNOSIS — E559 Vitamin D deficiency, unspecified: Secondary | ICD-10-CM | POA: Insufficient documentation

## 2017-02-02 DIAGNOSIS — Z860100 Personal history of colon polyps, unspecified: Secondary | ICD-10-CM

## 2017-02-02 DIAGNOSIS — K640 First degree hemorrhoids: Secondary | ICD-10-CM | POA: Insufficient documentation

## 2017-02-02 DIAGNOSIS — E663 Overweight: Secondary | ICD-10-CM | POA: Insufficient documentation

## 2017-02-02 DIAGNOSIS — N402 Nodular prostate without lower urinary tract symptoms: Secondary | ICD-10-CM | POA: Insufficient documentation

## 2017-02-02 DIAGNOSIS — Z1211 Encounter for screening for malignant neoplasm of colon: Secondary | ICD-10-CM | POA: Insufficient documentation

## 2017-02-02 HISTORY — PX: COLONOSCOPY WITH PROPOFOL: SHX5780

## 2017-02-02 HISTORY — DX: Other specified postprocedural states: Z98.890

## 2017-02-02 SURGERY — COLONOSCOPY WITH PROPOFOL
Anesthesia: Monitor Anesthesia Care

## 2017-02-02 MED ORDER — LIDOCAINE HCL (CARDIAC) 20 MG/ML IV SOLN
INTRAVENOUS | Status: DC | PRN
  Administered 2017-02-02: 50 mg via INTRAVENOUS

## 2017-02-02 MED ORDER — PROPOFOL 10 MG/ML IV BOLUS
INTRAVENOUS | Status: DC | PRN
  Administered 2017-02-02: 70 mg via INTRAVENOUS
  Administered 2017-02-02 (×5): 20 mg via INTRAVENOUS
  Administered 2017-02-02: 30 mg via INTRAVENOUS

## 2017-02-02 MED ORDER — OXYCODONE HCL 5 MG/5ML PO SOLN: 5.0000 mg | Freq: Once | ORAL | Status: DC | PRN

## 2017-02-02 MED ORDER — OXYCODONE HCL 5 MG PO TABS: 5.0000 mg | ORAL_TABLET | Freq: Once | ORAL | Status: DC | PRN

## 2017-02-02 MED ORDER — LACTATED RINGERS IV SOLN
INTRAVENOUS | Status: DC | PRN
  Administered 2017-02-02: 10:00:00 via INTRAVENOUS

## 2017-02-02 MED ORDER — STERILE WATER FOR IRRIGATION IR SOLN
Status: DC | PRN
  Administered 2017-02-02: 10:00:00

## 2017-02-02 SURGICAL SUPPLY — 23 items

## 2017-02-02 NOTE — Anesthesia Preprocedure Evaluation (Signed)
Anesthesia Evaluation  Patient identified by MRN, date of birth, ID band Patient awake    Reviewed: Allergy & Precautions, H&P , NPO status , Patient's Chart, lab work & pertinent test results  Airway Mallampati: II  TM Distance: >3 FB Neck ROM: full    Dental no notable dental hx.    Pulmonary neg pulmonary ROS,    Pulmonary exam normal        Cardiovascular negative cardio ROS Normal cardiovascular exam     Neuro/Psych    GI/Hepatic negative GI ROS, Neg liver ROS,   Endo/Other  negative endocrine ROS  Renal/GU negative Renal ROS     Musculoskeletal   Abdominal   Peds  Hematology negative hematology ROS (+)   Anesthesia Other Findings   Reproductive/Obstetrics negative OB ROS                             Anesthesia Physical Anesthesia Plan  ASA: II  Anesthesia Plan: MAC   Post-op Pain Management:    Induction:   Airway Management Planned:   Additional Equipment:   Intra-op Plan:   Post-operative Plan:   Informed Consent: I have reviewed the patients History and Physical, chart, labs and discussed the procedure including the risks, benefits and alternatives for the proposed anesthesia with the patient or authorized representative who has indicated his/her understanding and acceptance.     Plan Discussed with:   Anesthesia Plan Comments:         Anesthesia Quick Evaluation

## 2017-02-02 NOTE — H&P (Signed)
Mitchell Minium, MD [facility] [address], [address] [place], [place] [address] Phone:[phone] Fax : [phone]  Primary Care Physician:  Mitchell Cory, MD Primary Gastroenterologist:  Mitchell Doyle  Pre-Procedure History & Physical: HPI:  Mitchell Doyle is a 51 y.o. male is here for an colonoscopy.   Past Medical History:  Diagnosis Date  . Bladder distension   . History of colonoscopy 2014  . Hyperlipidemia   . 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 Other urinary incontinence   . Overweight   . Polyp of colon   . Premature ejaculation   . Vitamin D insufficiency     Past Surgical History:  Procedure Laterality Date  . TONSILLECTOMY    . transurethral incision of bladder neck Bilateral 12/20/2008    Prior to Admission medications   Medication Sig Start Date End Date Taking? Authorizing Provider  Cholecalciferol (VITAMIN D) 2000 UNITS tablet Take by mouth. 02/24/13  Yes [provider]  finasteride (PROSCAR) 5 MG tablet Take 1 tablet (5 mg total) by mouth daily. 03/01/16  Yes Sowles, Danna Hefty, MD  tamsulosin (FLOMAX) 0.4 MG CAPS capsule Take 1 capsule (0.4 mg total) by mouth daily. 03/01/16  Yes Sowles, Danna Hefty, MD  Vitamin D, Ergocalciferol, (DRISDOL) 50000 units CAPS capsule Take by mouth.   Yes [provider]  aspirin EC 81 MG tablet Take 1 tablet (81 mg total) by mouth daily. Patient not taking: Reported on 08/31/2016 03/01/16   Mitchell Cory, MD  azithromycin [facility]) 250 MG tablet Take as directed 2 first day and one daily after that Patient not taking: Reported on 01/26/2017 08/31/16   Mitchell Cory, MD  fluticasone [facility]) 50 MCG/ACT nasal spray Place 2 sprays into both nostrils daily. Patient not taking: Reported on 01/26/2017 08/31/16   Mitchell Cory, MD  fluticasone furoate-vilanterol (BREO ELLIPTA) 100-25 MCG/INH AEPB Inhale 1 puff into the lungs daily. Patient not taking: Reported on 01/26/2017 08/31/16   Mitchell Cory, MD    loratadine (CLARITIN) 10 MG tablet Take 1 tablet (10 mg total) by mouth daily. Patient not taking: Reported on 01/26/2017 08/31/16   Mitchell Cory, MD    Allergies as of 01/24/2017  . (No Known Allergies)    Family History  Problem Relation Age of Onset  . Hypertension Mother   . Hypertension Father     Social History   Social History  . Marital status: Married    Spouse name: N/A  . Number of children: N/A  . Years of education: N/A   Occupational History  . Not on file.   Social History Main Topics  . Smoking status: Never Smoker  . Smokeless tobacco: Never Used  . Alcohol use No  . Drug use: No  . Sexual activity: Yes    Partners: Female   Other Topics Concern  . Not on file   Social History Narrative  . No narrative on file    Review of Systems: See HPI, otherwise negative ROS  Physical Exam: BP 137/83   Pulse 83   Temp 98.1 F (36.7 C)   Ht 5\' 11"  (1.803 m)   Wt 180 lb (81.6 kg)   SpO2 100%   BMI 25.10 kg/m  General:   Alert,  pleasant and cooperative in NAD Head:  Normocephalic and atraumatic. Neck:  Supple; no masses or thyromegaly. Lungs:  Clear throughout to auscultation.    Heart:  Regular rate and rhythm. Abdomen:  Soft, nontender and nondistended. Normal bowel sounds, without guarding, and  without rebound.   Neurologic:  Alert and  oriented x4;  grossly normal neurologically.  Impression/Plan: Christoper AllegraArvis L Doyle is here for an colonoscopy to be performed for history of polyps  Risks, benefits, limitations, and alternatives regarding  colonoscopy have been reviewed with the patient.  Questions have been answered.  All parties agreeable.   Mitchell Miniumarren Mitchell Kimmet, MD  02/02/2017, 9:31 AM

## 2017-02-02 NOTE — Transfer of Care (Signed)
Immediate Anesthesia Transfer of Care Note  Patient: Mitchell Doyle  Procedure(s) Performed: Procedure(s): COLONOSCOPY WITH PROPOFOL (N/A)  Patient Location: PACU  Anesthesia Type: MAC  Level of Consciousness: awake, alert  and patient cooperative  Airway and Oxygen Therapy: Patient Spontanous Breathing and Patient connected to supplemental oxygen  Post-op Assessment: Post-op Vital signs reviewed, Patient's Cardiovascular Status Stable, Respiratory Function Stable, Patent Airway and No signs of Nausea or vomiting  Post-op Vital Signs: Reviewed and stable  Complications: No apparent anesthesia complications

## 2017-02-02 NOTE — Op Note (Signed)
[facility] Patient Name: Mitchell Doyle Procedure Date: 02/02/2017 10:00 AM MRN: [id number] Account #: [id number] Date of Birth: 24-May-1966 Admit Type: Outpatient Age: 51 Room: [facility] OR ROOM 01 Gender: Male Note Status: Finalized Procedure:            Colonoscopy Indications:          High risk colon cancer surveillance: Personal history                        of colonic polyps Providers:            Midge Minium MD, MD Referring MD:         Onnie Boer. Sowles, MD (Referring MD) Medicines:            Propofol per Anesthesia Complications:        No immediate complications. Procedure:            Pre-Anesthesia Assessment:                       - Prior to the procedure, a History and Physical was                        performed, and patient medications and allergies were                        reviewed. The patient's tolerance of previous                        anesthesia was also reviewed. The risks and benefits of                        the procedure and the sedation options and risks were                        discussed with the patient. All questions were                        answered, and informed consent was obtained. Prior                        Anticoagulants: The patient has taken no previous                        anticoagulant or antiplatelet agents. ASA Grade                        Assessment: II - A patient with mild systemic disease.                        After reviewing the risks and benefits, the patient was                        deemed in satisfactory condition to undergo the                        procedure.                       After obtaining informed consent, the colonoscope was  passed under direct vision. Throughout the procedure,                        the patient's blood pressure, pulse, and oxygen                        saturations were monitored continuously. The Olympus   Colonoscope 190 [id number]) was introduced through the                        anus and advanced to the the cecum, identified by                        appendiceal orifice and ileocecal valve. The                        colonoscopy was performed without difficulty. The                        patient tolerated the procedure well. The quality of                        the bowel preparation was excellent. Findings:      The perianal and digital rectal examinations were normal.      Internal hemorrhoids were found during retroflexion. The hemorrhoids       were Grade I (internal hemorrhoids that do not prolapse). Impression:           - Internal hemorrhoids.                       - No specimens collected. Recommendation:       - Discharge patient to home.                       - Resume previous diet.                       - Continue present medications.                       - Repeat colonoscopy in 5 years for surveillance. Procedure Code(s):    --- Professional ---                       [phone], Colonoscopy, flexible; diagnostic, including                        collection of specimen(s) by brushing or washing, when                        performed (separate procedure) Diagnosis Code(s):    --- Professional ---                       Z86.010, Personal history of colonic polyps CPT copyright 2016 American Medical Association. All rights reserved. The codes documented in this report are preliminary and upon coder review may  be revised to meet current compliance requirements. Midge Miniumarren Seretha Estabrooks MD, MD 02/02/2017 10:23:13 AM This report has been signed electronically. Number of Addenda: 0 Note Initiated On: 02/02/2017 10:00 AM Scope Withdrawal Time: 0 hours 8 minutes 2 seconds  Total Procedure Duration: 0 hours 13 minutes 14 seconds  [facility]

## 2017-02-02 NOTE — Anesthesia Procedure Notes (Signed)
Procedure Name: MAC Performed by: Mayme Genta Pre-anesthesia Checklist: Patient identified, Emergency Drugs available, Suction available, Timeout performed and Patient being monitored Patient Re-evaluated:Patient Re-evaluated prior to inductionOxygen Delivery Method: Nasal cannula Placement Confirmation: positive ETCO2

## 2017-02-02 NOTE — Anesthesia Postprocedure Evaluation (Signed)
Anesthesia Post Note  Patient: Mitchell Doyle  Procedure(s) Performed: Procedure(s) (LRB): COLONOSCOPY WITH PROPOFOL (N/A)  Patient location during evaluation: PACU Anesthesia Type: MAC Level of consciousness: awake and alert Pain management: pain level controlled Vital Signs Assessment: post-procedure vital signs reviewed and stable Respiratory status: spontaneous breathing Cardiovascular status: blood pressure returned to baseline Postop Assessment: no headache Anesthetic complications: no    Jaci Standard, III,  Cortlin Marano D

## 2017-02-06 ENCOUNTER — Encounter: Payer: Self-pay | Admitting: Gastroenterology

## 2017-03-04 ENCOUNTER — Other Ambulatory Visit: Payer: Self-pay | Admitting: Family Medicine

## 2017-03-04 DIAGNOSIS — N138 Other obstructive and reflux uropathy: Secondary | ICD-10-CM

## 2017-03-04 DIAGNOSIS — N401 Enlarged prostate with lower urinary tract symptoms: Principal | ICD-10-CM

## 2017-03-05 ENCOUNTER — Ambulatory Visit (INDEPENDENT_AMBULATORY_CARE_PROVIDER_SITE_OTHER): Payer: Managed Care, Other (non HMO) | Admitting: Family Medicine

## 2017-03-05 ENCOUNTER — Encounter: Payer: Self-pay | Admitting: Family Medicine

## 2017-03-05 VITALS — BP 110/78 | HR 84 | Temp 98.3°F | Resp 16 | Ht 71.0 in | Wt 182.2 lb

## 2017-03-05 DIAGNOSIS — E559 Vitamin D deficiency, unspecified: Secondary | ICD-10-CM | POA: Diagnosis not present

## 2017-03-05 DIAGNOSIS — Z86010 Personal history of colonic polyps: Secondary | ICD-10-CM

## 2017-03-05 DIAGNOSIS — Z9229 Personal history of other drug therapy: Secondary | ICD-10-CM

## 2017-03-05 DIAGNOSIS — K648 Other hemorrhoids: Secondary | ICD-10-CM | POA: Insufficient documentation

## 2017-03-05 DIAGNOSIS — N401 Enlarged prostate with lower urinary tract symptoms: Secondary | ICD-10-CM | POA: Diagnosis not present

## 2017-03-05 DIAGNOSIS — N138 Other obstructive and reflux uropathy: Secondary | ICD-10-CM

## 2017-03-05 DIAGNOSIS — Z13220 Encounter for screening for lipoid disorders: Secondary | ICD-10-CM

## 2017-03-05 DIAGNOSIS — Z131 Encounter for screening for diabetes mellitus: Secondary | ICD-10-CM

## 2017-03-05 DIAGNOSIS — Z79899 Other long term (current) drug therapy: Secondary | ICD-10-CM

## 2017-03-05 DIAGNOSIS — Z0000 Encounter for general adult medical examination without abnormal findings: Secondary | ICD-10-CM

## 2017-03-05 LAB — CBC WITH DIFFERENTIAL/PLATELET
BASOS ABS: 65 {cells}/uL (ref 0–200)
Basophils Relative: 1 %
EOS PCT: 1 %
Eosinophils Absolute: 65 cells/uL (ref 15–500)
HCT: 42.8 % (ref 38.5–50.0)
Hemoglobin: 13.8 g/dL (ref 13.2–17.1)
Lymphocytes Relative: 28 %
Lymphs Abs: 1820 cells/uL (ref 850–3900)
MCH: 26.1 pg — AB (ref 27.0–33.0)
MCHC: 32.2 g/dL (ref 32.0–36.0)
MCV: 80.9 fL (ref 80.0–100.0)
MONOS PCT: 7 %
MPV: 9.7 fL (ref 7.5–12.5)
Monocytes Absolute: 455 cells/uL (ref 200–950)
NEUTROS ABS: 4095 {cells}/uL (ref 1500–7800)
Neutrophils Relative %: 63 %
PLATELETS: 274 10*3/uL (ref 140–400)
RBC: 5.29 MIL/uL (ref 4.20–5.80)
RDW: 16.9 % — ABNORMAL HIGH (ref 11.0–15.0)
WBC: 6.5 10*3/uL (ref 3.8–10.8)

## 2017-03-05 LAB — COMPLETE METABOLIC PANEL WITH GFR
ALK PHOS: 82 U/L (ref 40–115)
ALT: 16 U/L (ref 9–46)
AST: 19 U/L (ref 10–35)
Albumin: 4.1 g/dL (ref 3.6–5.1)
BILIRUBIN TOTAL: 0.7 mg/dL (ref 0.2–1.2)
BUN: 30 mg/dL — ABNORMAL HIGH (ref 7–25)
CALCIUM: 9.7 mg/dL (ref 8.6–10.3)
CO2: 23 mmol/L (ref 20–31)
Chloride: 104 mmol/L (ref 98–110)
Creat: 2.5 mg/dL — ABNORMAL HIGH (ref 0.70–1.33)
GFR, EST AFRICAN AMERICAN: 33 mL/min — AB (ref >=60)
GFR, EST NON AFRICAN AMERICAN: 29 mL/min — AB (ref >=60)
Glucose, Bld: 74 mg/dL (ref 65–99)
Potassium: 6 mmol/L — ABNORMAL HIGH (ref 3.5–5.3)
Sodium: 137 mmol/L (ref 135–146)
TOTAL PROTEIN: 7.9 g/dL (ref 6.1–8.1)

## 2017-03-05 LAB — LIPID PANEL
Cholesterol: 199 mg/dL (ref <200)
HDL: 48 mg/dL (ref >40)
LDL CALC: 135 mg/dL — AB (ref <100)
TRIGLYCERIDES: 79 mg/dL (ref <150)
Total CHOL/HDL Ratio: 4.1 Ratio (ref <5.0)
VLDL: 16 mg/dL (ref <30)

## 2017-03-05 MED ORDER — FINASTERIDE 5 MG PO TABS: 5.0000 mg | ORAL_TABLET | Freq: Every day | ORAL | 3 refills | Status: DC

## 2017-03-05 NOTE — Progress Notes (Signed)
Name: Mitchell Doyle   MRN: [id number]    DOB: 02/12/1966   Date:03/05/2017       Progress Note  Subjective  Chief Complaint  Chief Complaint  Patient presents with  . Annual Exam    HPI  Well male:  He is on medication for BPH, no family history of prostate cancer, no ED.   IPSS Questionnaire (AUA-7): Over the past month.   1)  How often have you had a sensation of not emptying your bladder completely after you finish urinating?  0- Never  2)  How often have you had to urinate again less than two hours after you finished urinating? 1 - Less than 1 time in 5  3)  How often have you found you stopped and started again several times when you urinated?  0 - Not at all  4) How difficult have you found it to postpone urination?  0 - Not at all  5) How often have you had a weak urinary stream?  2 - Less than half the time  6) How often have you had to push or strain to begin urination?  0 - Not at all  7) How many times did you most typically get up to urinate from the time you went to bed until the time you got up in the morning?  1 - 1 time  Total score:  0-7 mildly symptomatic   8-19 moderately symptomatic   20-35 severely symptomatic    Patient Active Problem List   Diagnosis Date Noted  . Internal hemorrhoids without complication 03/05/2017  . Personal history of colonic polyps   . Benign prostatic hyperplasia with urinary obstruction 02/27/2015  . History of urinary retention 02/27/2015  . Vitamin D deficiency 08/10/2009  . Dyslipidemia 02/11/2007    Past Surgical History:  Procedure Laterality Date  . COLONOSCOPY WITH PROPOFOL N/A 02/02/2017   Procedure: COLONOSCOPY WITH PROPOFOL;  Surgeon: Midge Minium, MD;  [facility];  Service: Endoscopy;  Laterality: N/A;  . TONSILLECTOMY    . transurethral incision of bladder neck Bilateral 12/20/2008    Family History  Problem Relation Age of Onset  . Hypertension Mother   . Hypertension Father     Social  History   Social History  . Marital status: Married    Spouse name: N/A  . Number of children: N/A  . Years of education: N/A   Occupational History  . Not on file.   Social History Main Topics  . Smoking status: Never Smoker  . Smokeless tobacco: Never Used  . Alcohol use No  . Drug use: No  . Sexual activity: Yes    Partners: Female   Other Topics Concern  . Not on file   Social History Narrative  . No narrative on file     Current Outpatient Prescriptions:  .  aspirin EC 81 MG tablet, Take 1 tablet (81 mg total) by mouth daily. (Patient not taking: Reported on 08/31/2016), Disp: 30 tablet, Rfl: 0 .  Cholecalciferol (VITAMIN D) 2000 UNITS tablet, Take by mouth., Disp: , Rfl:  .  finasteride (PROSCAR) 5 MG tablet, Take 1 tablet (5 mg total) by mouth daily., Disp: 90 tablet, Rfl: 3 .  tamsulosin (FLOMAX) 0.4 MG CAPS capsule, TAKE 1 CAPSULE BY MOUTH DAILY, Disp: 90 capsule, Rfl: 2  No Known Allergies   ROS  Constitutional: Negative for fever or weight change.  Respiratory: Negative for cough and shortness of breath.   Cardiovascular: Negative for  chest pain or palpitations.  Gastrointestinal: Negative for abdominal pain, no bowel changes.  Musculoskeletal: Negative for gait problem or joint swelling.  Skin: Negative for rash.  Neurological: Negative for dizziness or headache.  No other specific complaints in a complete review of systems (except as listed in HPI above).  Objective  Vitals:   03/05/17 0817  BP: 110/78  Pulse: 84  Resp: 16  Temp: 98.3 F (36.8 C)  SpO2: 94%  Weight: 182 lb 3 oz (82.6 kg)  Height: 5\' 11"  (1.803 m)    Body mass index is 25.41 kg/m.  Physical Exam  Constitutional: Patient appears well-developed and well-nourished. No distress.  HENT: Head: Normocephalic and atraumatic. Ears: B TMs ok, no erythema or effusion; Nose: Nose normal. Mouth/Throat: Oropharynx is clear and moist. No oropharyngeal exudate.  Eyes: Conjunctivae and  EOM are normal. Pupils are equal, round, and reactive to light. No scleral icterus.  Neck: Normal range of motion. Neck supple. No JVD present. No thyromegaly present.  Cardiovascular: Normal rate, regular rhythm and normal heart sounds.  No murmur heard. No BLE edema. Pulmonary/Chest: Effort normal and breath sounds normal. No respiratory distress. Abdominal: Soft. Bowel sounds are normal, no distension. There is no tenderness. no masses MALE GENITALIA: Normal descended testes bilaterally, no masses palpated, no hernias, no lesions, no discharge RECTAL: Prostate boggy and enlarged - no pain, no rectal masses  Musculoskeletal: Normal range of motion, no joint effusions. No gross deformities Neurological: he is alert and oriented to person, place, and time. No cranial nerve deficit. Coordination, balance, strength, speech and gait are normal.  Skin: Skin is warm and dry. No rash noted. No erythema.  Psychiatric: Patient has a normal mood and affect. behavior is normal. Judgment and thought content normal.  PHQ2/9: Depression screen [facility] 2/9 08/31/2016 03/01/2016 03/01/2015  Decreased Interest 0 0 0  Down, Depressed, Hopeless 0 0 0  PHQ - 2 Score 0 0 0    Fall Risk: Fall Risk  08/31/2016 03/01/2016 03/01/2015  Falls in the past year? No No No    Functional Status Survey: Is the patient deaf or have difficulty hearing?: No Does the patient have difficulty seeing, even when wearing glasses/contacts?: No Does the patient have difficulty concentrating, remembering, or making decisions?: No Does the patient have difficulty walking or climbing stairs?: No Does the patient have difficulty dressing or bathing?: No Does the patient have difficulty doing errands alone such as visiting a doctor's office or shopping?: No    Assessment & Plan  1. Encounter for routine history and physical exam for male  Discussed importance of 150 minutes of physical activity weekly, eat two servings of fish  weekly, eat one serving of tree nuts ( cashews, pistachios, pecans, almonds.Marland Kitchen) every other day, eat 6 servings of fruit/vegetables daily and drink plenty of water and avoid sweet beverages.   2. History of colon polyps  Up to date with colonoscopy, last one in 2018 and not polyps - repeat in 5 years  3. Benign prostatic hyperplasia with urinary obstruction  - PSA - finasteride (PROSCAR) 5 MG tablet; Take 1 tablet (5 mg total) by mouth daily.  Dispense: 90 tablet; Refill: 3  4. Lipid screening  - Lipid panel  5. Vitamin D deficiency  - VITAMIN D 25 Hydroxy (Vit-D Deficiency, Fractures)  6. Encounter for screening examination for impaired glucose regulation and diabetes mellitus  - Hemoglobin A1c  7. Hx of long-term treatment with high-risk medication  - COMPLETE METABOLIC PANEL WITH GFR -  CBC with Differential/Platelet  8. Internal hemorrhoids without complication  On colonoscopy

## 2017-03-06 ENCOUNTER — Other Ambulatory Visit: Payer: Self-pay | Admitting: Family Medicine

## 2017-03-06 DIAGNOSIS — N183 Chronic kidney disease, stage 3 unspecified: Secondary | ICD-10-CM | POA: Insufficient documentation

## 2017-03-06 DIAGNOSIS — R944 Abnormal results of kidney function studies: Secondary | ICD-10-CM

## 2017-03-06 LAB — HEMOGLOBIN A1C
HEMOGLOBIN A1C: 5.2 % (ref <5.7)
Mean Plasma Glucose: 103 mg/dL

## 2017-03-06 LAB — PSA: PSA: 0.8 ng/mL (ref <=4.0)

## 2017-03-06 LAB — VITAMIN D 25 HYDROXY (VIT D DEFICIENCY, FRACTURES): Vit D, 25-Hydroxy: 36 ng/mL (ref 30–100)

## 2017-03-06 NOTE — Progress Notes (Unsigned)
Add-on labs

## 2017-03-07 ENCOUNTER — Telehealth: Payer: Self-pay

## 2017-03-07 ENCOUNTER — Other Ambulatory Visit: Payer: Self-pay

## 2017-03-07 DIAGNOSIS — R944 Abnormal results of kidney function studies: Secondary | ICD-10-CM

## 2017-03-07 NOTE — Telephone Encounter (Signed)
Left message for patient to return my call regarding labs.  

## 2017-03-07 NOTE — Telephone Encounter (Signed)
-----   Message from Alba CoryKrichna Sowles, MD sent at 03/06/2017  5:40 PM EDT ----- He will need to see nephrologist, drop in kidney function, we will also need to add CBC to his labs if possible and ask him to return for urine/creatinine ration

## 2017-03-07 NOTE — Progress Notes (Signed)
When would you like the patient to come back for these?

## 2017-10-12 ENCOUNTER — Encounter: Payer: Self-pay | Admitting: Family Medicine

## 2017-10-12 ENCOUNTER — Ambulatory Visit (INDEPENDENT_AMBULATORY_CARE_PROVIDER_SITE_OTHER): Payer: Managed Care, Other (non HMO) | Admitting: Family Medicine

## 2017-10-12 VITALS — BP 110/60 | HR 69 | Resp 14 | Ht 71.0 in | Wt 186.3 lb

## 2017-10-12 DIAGNOSIS — Z23 Encounter for immunization: Secondary | ICD-10-CM | POA: Diagnosis not present

## 2017-10-12 DIAGNOSIS — D649 Anemia, unspecified: Secondary | ICD-10-CM

## 2017-10-12 DIAGNOSIS — N183 Chronic kidney disease, stage 3 unspecified: Secondary | ICD-10-CM

## 2017-10-12 DIAGNOSIS — E559 Vitamin D deficiency, unspecified: Secondary | ICD-10-CM | POA: Diagnosis not present

## 2017-10-12 DIAGNOSIS — Z87438 Personal history of other diseases of male genital organs: Secondary | ICD-10-CM

## 2017-10-12 DIAGNOSIS — N138 Other obstructive and reflux uropathy: Secondary | ICD-10-CM

## 2017-10-12 DIAGNOSIS — N401 Enlarged prostate with lower urinary tract symptoms: Secondary | ICD-10-CM

## 2017-10-12 NOTE — Progress Notes (Signed)
Name: Mitchell Doyle   MRN: [id number]    DOB: 02-Feb-1966   Date:10/12/2017       Progress Note  Subjective  Chief Complaint  Chief Complaint  Patient presents with  . Epididymitis    HPI  Epididymitis: he developed left testicle a few weeks ago, [facility] and [facility] US and found to have epididymitis, he was given Levaquin and symptoms resolved. His urine culture was positive for E.coli  CKI: he had a GFR of 33 June 2018 with elevation of potassium level, [facility] GFR was 36 %, he came in today to discuss results. He also has anemia, last colonoscopy 2018 and normal except for hemorrhoids. He denies pruritis, except from dry skin this time of the year, normal urine output, no nausea or vomiting, no fatigue. He denies taking NSAID's  BPH: he has a history of urinary retention many years ago , he is not seeing urologist lately, [facility] ( [facility] ). Last PsA 6 months ago was normal . He states nocturia is once per night  Dyslipidemia: he was taking super beats supplemented but stopped a few weeks ago, he tries to eat healthy, exercises, not taking aspirin , and advised to resume 81 mg daily   Patient Active Problem List   Diagnosis Date Noted  . Decreased GFR 03/06/2017  . Internal hemorrhoids without complication 03/05/2017  . Personal history of colonic polyps   . Benign prostatic hyperplasia with urinary obstruction 02/27/2015  . History of urinary retention 02/27/2015  . Vitamin D deficiency 08/10/2009  . Dyslipidemia 02/11/2007    Past Surgical History:  Procedure Laterality Date  . COLONOSCOPY WITH PROPOFOL N/A 02/02/2017   Procedure: COLONOSCOPY WITH PROPOFOL;  Surgeon: Midge MiniumWohl, Darren, MD;  [facility];  Service: Endoscopy;  Laterality: N/A;  . TONSILLECTOMY    . transurethral incision of bladder neck Bilateral 12/20/2008    Family History  Problem Relation Age of Onset  . Hypertension  Mother   . Hypertension Father     Social History   Socioeconomic History  . Marital status: Married    Spouse name: Not on file  . Number of children: Not on file  . Years of education: Not on file  . Highest education level: Not on file  Social Needs  . Financial resource strain: Not on file  . Food insecurity - worry: Not on file  . Food insecurity - inability: Not on file  . Transportation needs - medical: Not on file  . Transportation needs - non-medical: Not on file  Occupational History  . Not on file  Tobacco Use  . Smoking status: Never Smoker  . Smokeless tobacco: Never Used  Substance and Sexual Activity  . Alcohol use: No    Alcohol/week: 0.0 oz  . Drug use: No  . Sexual activity: Yes    Partners: Female  Other Topics Concern  . Not on file  Social History Narrative  . Not on file     Current Outpatient Medications:  .  aspirin EC 81 MG tablet, Take 1 tablet (81 mg total) by mouth daily., Disp: 30 tablet, Rfl: 0 .  Cholecalciferol (VITAMIN D) 2000 UNITS tablet, Take by mouth., Disp: , Rfl:  .  finasteride (PROSCAR) 5 MG tablet, Take 1 tablet (5 mg total) by mouth daily., Disp: 90 tablet, Rfl: 3 .  tamsulosin (FLOMAX) 0.4 MG CAPS capsule, TAKE 1  CAPSULE BY MOUTH DAILY, Disp: 90 capsule, Rfl: 2  No Known Allergies   ROS  Constitutional: Negative for fever or weight change.  Respiratory: Negative for cough and shortness of breath.   Cardiovascular: Negative for chest pain or palpitations.  Gastrointestinal: Negative for abdominal pain, no bowel changes.  Musculoskeletal: Negative for gait problem or joint swelling.  Skin: Negative for rash.  Neurological: Negative for dizziness or headache.  No other specific complaints in a complete review of systems (except as listed in HPI above).  Objective  Vitals:   10/12/17 0825  BP: 110/60  Pulse: 69  Resp: 14  SpO2: 98%  Weight: 186 lb 4.8 oz (84.5 kg)  Height: 5\' 11"  (1.803 m)    Body mass index  is 25.98 kg/m.  Physical Exam  Constitutional: Patient appears well-developed and well-nourished.  No distress.  HEENT: head atraumatic, normocephalic, pupils equal and reactive to light,  neck supple, throat within normal limits Cardiovascular: Normal rate, regular rhythm and normal heart sounds.  No murmur heard. No BLE edema. Pulmonary/Chest: Effort normal and breath sounds normal. No respiratory distress. Abdominal: Soft.  There is no tenderness. Psychiatric: Patient has a normal mood and affect. behavior is normal. Judgment and thought content normal.   PHQ2/9: Depression screen [facility] 2/9 10/12/2017 08/31/2016 03/01/2016 03/01/2015  Decreased Interest 0 0 0 0  Down, Depressed, Hopeless 0 0 0 0  PHQ - 2 Score 0 0 0 0     Fall Risk: Fall Risk  10/12/2017 08/31/2016 03/01/2016 03/01/2015  Falls in the past year? No No No No     Functional Status Survey: Is the patient deaf or have difficulty hearing?: No Does the patient have difficulty seeing, even when wearing glasses/contacts?: No Does the patient have difficulty concentrating, remembering, or making decisions?: No Does the patient have difficulty walking or climbing stairs?: No Does the patient have difficulty dressing or bathing?: No Does the patient have difficulty doing errands alone such as visiting a doctor's office or shopping?: No   Assessment & Plan  1. Chronic kidney insufficiency, stage 3 (moderate) (HCC)  We sent referral to nephrologist last year but he states he never heard about the referral  - VITAMIN D 25 Hydroxy (Vit-D Deficiency, Fractures) - CBC with Differential/Platelet - Comprehensive metabolic panel - Parathyroid hormone, intact (no Ca) - Urine Microalbumin w/creat. Ratio - referral nephrologist   2. Vitamin D deficiency  - VITAMIN D 25 Hydroxy (Vit-D Deficiency, Fractures)  3. Benign prostatic hyperplasia with urinary obstruction  Resume follow up with Dr. Lennie Odor  4. History of  epididymitis  - Urine Culture  5. Anemia, unspecified type  - CBC with Differential/Platelet - Iron, TIBC and Ferritin Panel; Future

## 2017-10-12 NOTE — Addendum Note (Signed)
Addended by: Alba CorySOWLES, Taren Toops F on: 10/12/2017 09:05 AM   Modules accepted: Orders

## 2017-10-13 LAB — URINE CULTURE
MICRO NUMBER: 90140905
Result:: NO GROWTH
SPECIMEN QUALITY: ADEQUATE

## 2017-10-17 ENCOUNTER — Other Ambulatory Visit: Payer: Self-pay | Admitting: Family Medicine

## 2017-10-17 DIAGNOSIS — N138 Other obstructive and reflux uropathy: Secondary | ICD-10-CM

## 2017-10-17 DIAGNOSIS — N401 Enlarged prostate with lower urinary tract symptoms: Principal | ICD-10-CM

## 2017-10-17 NOTE — Telephone Encounter (Signed)
Refill request for general medication: Flomax 0.4 mg  Last office visit: 10/12/2017  Last physical exam: 03/05/2017  Follow-up on file. 03/08/2018

## 2017-11-08 ENCOUNTER — Other Ambulatory Visit: Payer: Self-pay | Admitting: Nephrology

## 2017-11-08 DIAGNOSIS — N183 Chronic kidney disease, stage 3 unspecified: Secondary | ICD-10-CM

## 2017-11-09 ENCOUNTER — Ambulatory Visit
Admission: RE | Admit: 2017-11-09 | Discharge: 2017-11-09 | Disposition: A | Discharge status: Home / Self Care | Payer: Managed Care, Other (non HMO) | Source: Ambulatory Visit | Attending: Nephrology | Admitting: Nephrology

## 2017-11-09 DIAGNOSIS — N183 Chronic kidney disease, stage 3 unspecified: Secondary | ICD-10-CM

## 2017-11-09 DIAGNOSIS — N1330 Unspecified hydronephrosis: Secondary | ICD-10-CM | POA: Insufficient documentation

## 2017-11-12 ENCOUNTER — Encounter: Payer: Self-pay | Admitting: Urology

## 2017-11-12 ENCOUNTER — Ambulatory Visit (INDEPENDENT_AMBULATORY_CARE_PROVIDER_SITE_OTHER): Payer: Managed Care, Other (non HMO) | Admitting: Urology

## 2017-11-12 VITALS — BP 147/84 | HR 80 | Resp 16 | Ht 71.0 in | Wt 190.0 lb

## 2017-11-12 DIAGNOSIS — R339 Retention of urine, unspecified: Secondary | ICD-10-CM

## 2017-11-12 DIAGNOSIS — N1330 Unspecified hydronephrosis: Secondary | ICD-10-CM

## 2017-11-12 DIAGNOSIS — N183 Chronic kidney disease, stage 3 unspecified: Secondary | ICD-10-CM

## 2017-11-12 LAB — BLADDER SCAN AMB NON-IMAGING

## 2017-11-12 NOTE — Progress Notes (Signed)
11/12/2017 8:50 AM   Mitchell Doyle 1966-01-29 [id number]  Referring provider: Alba Cory, MD [address] [place], [place] [address]  Chief Complaint  Patient presents with  . Hydronephrosis    New Patient    HPI: Patient is a 52 year old African-American male referred to [name] by Dr. Cherylann Doyle for left hydronephrosis and a large post void residual in the urinary bladder.  He underwent TUR of the bladder neck on 12/21/2014 at [facility] after he experienced failed voiding trial while on alpha blockers and dutasteride.   He failed a fill and pull at [facility] on 12/31/2014 and was instructed on CIC.  He was then lost to follow up.    He was recently seen by his PCP for a follow-up for left epididymitis.  He was seen at [facility] and a testicular ultrasound confirmed epididymitis and he was given a course of Levaquin and his symptoms resolved.  His urine culture was positive for E. Coli.  He was referred on to nephrology for his CKD.  A renal ultrasound was obtained on November 09, 2017 and it noted moderate left-sided hydronephrosis, increased compared to the previous study of 08/26/2014. Consider CT for further characterization.  Normal right kidney.  Large postvoid residual in the bladder, possibly contributing to the left-sided hydronephrosis.  He was then referred on to [name].    He states that he stopped self cathing approximately 2 months ago.  Prior to that, he was self cathing every 2-3 months.  He has baseline urinary complaints are nocturia and incontinence.  Patient denies any gross hematuria, dysuria or suprapubic/flank pain.  Patient denies any fevers, chills, nausea or vomiting.   His PVR today is > 864 mL.  Current creatinine is 2.3.  He is still taking the tamsulosin and finasteride.  Most recent PSA was 0.8 in 02/2017.     PMH: Past Medical History:  Diagnosis Date  . Bladder distension   . History of colonoscopy 2014  . Hyperlipidemia   . Nodular prostate with lower  urinary tract symptoms   . Other urinary incontinence   . Overweight   . Polyp of colon   . Premature ejaculation   . Vitamin D insufficiency     Surgical History: Past Surgical History:  Procedure Laterality Date  . COLONOSCOPY WITH PROPOFOL N/A 02/02/2017   Procedure: COLONOSCOPY WITH PROPOFOL;  Surgeon: Midge Minium, MD;  Location[facility];  Service: Endoscopy;  Laterality: N/A;  . TONSILLECTOMY    . transurethral incision of bladder neck Bilateral 12/20/2008    Home Medications:  Allergies as of 11/12/2017   No Known Allergies     Medication List        Accurate as of 11/12/17 11:59 PM. Always use your most recent med list.          aspirin EC 81 MG tablet Take 1 tablet (81 mg total) by mouth daily.   finasteride 5 MG tablet Commonly known as:  PROSCAR Take 1 tablet (5 mg total) by mouth daily.   tamsulosin 0.4 MG Caps capsule Commonly known as:  FLOMAX TAKE 1 CAPSULE BY MOUTH DAILY   Vitamin D 2000 units tablet Take by mouth.       Allergies: No Known Allergies  Family History: Family History  Problem Relation Age of Onset  . Hypertension Mother   . Hypertension Father   . Prostate cancer Neg Hx   . Kidney cancer Neg Hx   . Bladder Cancer Neg Hx  Social History:  reports that  has never smoked. he has never used smokeless tobacco. He reports that he does not drink alcohol or use drugs.  ROS: UROLOGY Frequent Urination?: No Hard to postpone urination?: No Burning/pain with urination?: No Get up at night to urinate?: Yes Leakage of urine?: Yes Urine stream starts and stops?: No Trouble starting stream?: No Do you have to strain to urinate?: No Blood in urine?: No Urinary tract infection?: No Sexually transmitted disease?: No Injury to kidneys or bladder?: No Painful intercourse?: No Weak stream?: No Erection problems?: No Penile pain?: No  Gastrointestinal Nausea?: No Vomiting?: No Indigestion/heartburn?: No Diarrhea?:  No Constipation?: No  Constitutional Fever: No Night sweats?: No Weight loss?: No Fatigue?: No  Skin Skin rash/lesions?: No Itching?: No  Eyes Blurred vision?: No Double vision?: No  Ears/Nose/Throat Sore throat?: No Sinus problems?: No  Hematologic/Lymphatic Swollen glands?: No Easy bruising?: No  Cardiovascular Leg swelling?: No Chest pain?: No  Respiratory Cough?: No Shortness of breath?: No  Endocrine Excessive thirst?: No  Musculoskeletal Back pain?: No Joint pain?: No  Neurological Headaches?: No Dizziness?: No  Psychologic Depression?: No Anxiety?: No  Physical Exam: BP (!) 147/84   Pulse 80   Resp 16   Ht 5\' 11"  (1.803 m)   Wt 190 lb (86.2 kg)   SpO2 98%   BMI 26.50 kg/m   Constitutional: Well nourished. Alert and oriented, No acute distress. HEENT: [place] AT, moist mucus membranes. Trachea midline, no masses. Cardiovascular: No clubbing, cyanosis, or edema. Respiratory: Normal respiratory effort, no increased work of breathing. GI: Abdomen is soft, non tender, non distended, no abdominal masses. Liver and spleen not palpable.  No hernias appreciated.  Stool sample for occult testing is not indicated.   GU: No CVA tenderness.  No bladder fullness or masses.  Patient with uncircumcised phallus.  Foreskin easily retracted  Urethral meatus is patent.  No penile discharge. No penile lesions or rashes. Scrotum without lesions, cysts, rashes and/or edema.  Testicles are located scrotally bilaterally. No masses are appreciated in the testicles. Left and right epididymis are normal. Rectal: Patient with  normal sphincter tone. Anus and perineum without scarring or rashes. No rectal masses are appreciated. Prostate is approximately 60 grams, no nodules are appreciated. Seminal vesicles are normal. Skin: No rashes, bruises or suspicious lesions. Lymph: No cervical or inguinal adenopathy. Neurologic: Grossly intact, no focal deficits, moving all 4  extremities. Psychiatric: Normal mood and affect.  Laboratory Data: Lab Results  Component Value Date   WBC 6.5 03/05/2017   HGB 13.8 03/05/2017   HCT 42.8 03/05/2017   MCV 80.9 03/05/2017   PLT 274 03/05/2017    Lab Results  Component Value Date   CREATININE 2.20 (H) 11/12/2017    Lab Results  Component Value Date   PSA 0.8 03/05/2017   PSA 0.82 03/01/2016    No results found for: TESTOSTERONE  Lab Results  Component Value Date   HGBA1C 5.2 03/05/2017    Lab Results  Component Value Date   TSH 1.17 03/01/2016       Component Value Date/Time   CHOL 199 03/05/2017 0928   CHOL 242 (H) 03/01/2015 1050   HDL 48 03/05/2017 0928   HDL 55 03/01/2015 1050   CHOLHDL 4.1 03/05/2017 0928   VLDL 16 03/05/2017 0928   LDLCALC 135 (H) 03/05/2017 0928   LDLCALC 169 (H) 03/01/2015 1050    Lab Results  Component Value Date   AST 19 03/05/2017   Lab  Results  Component Value Date   ALT 16 03/05/2017   No components found for: ALKALINEPHOPHATASE No components found for: BILIRUBINTOTAL  No results found for: ESTRADIOL  Urinalysis No results found for: COLORURINE, APPEARANCEUR, LABSPEC, PHURINE, GLUCOSEU, HGBUR, BILIRUBINUR, KETONESUR, PROTEINUR, UROBILINOGEN, NITRITE, LEUKOCYTESUR  I have reviewed the labs.   Pertinent Imaging: Indication: evaluate for testicular flow, pain, N50.819 Testicular pain, unspecified  Comparison: None   Technique: Gray-scale, color Doppler, and spectral waveform tracings of the scrotum.  Findings: The right testis is of normal echogenicity and measures 2.2 x 4.1 x 2.4 cm. No intratesticular masses are identified. Arterial and venous blood flow are documented in the right testis on color Doppler and spectral Doppler evaluation. The right epididymal head measures 0.6 x 0.4 x 1.0 cm. There is a trace hydrocele.   The left testis is of normal echogenicity and measures 2.3 x 4.0 x 2.6 cm. No intratesticular masses are identified.  Arterial and venous blood flow are documented in the left testis on color Doppler and spectral Doppler evaluation. The left epididymal head measures 1.4 x 0.8 x 1.2 cm. The left epididymis is enlarged and hyperemic on color Doppler evaluation. There is a trace hydrocele.    Impression:  1. Findings concerning for left-sided epididymitis. 2. Trace bilateral hydroceles.    Discussed1/18/2019 1:22 PM by Davy PiqueJeffrey Cooper, MD, [facility] with Dr. Clenton PareKEVIN MCKENZIE GURYSH.  Electronically Reviewed JX:BJYNWGNby:Jeffrey Excell Seltzerooper, MD, [facility] Electronically Reviewed on:09/28/2017 1:23 PM  I have reviewed the images and concur with the above findings.  Electronically Signed FA:OZHYby:Chad Hyacinth MeekerMiller, MD, [facility] Electronically Signed on:09/28/2017 8:07 PM  Other Result Information  Images not available for scrotal ultrasound  CLINICAL DATA:  Chronic kidney disease, stage III.  EXAM: RENAL / URINARY TRACT ULTRASOUND COMPLETE  COMPARISON:  Renal ultrasound dated 08/26/2014  FINDINGS: Right Kidney:  Length: 10.2 cm. Echogenicity within normal limits. No mass or hydronephrosis visualized.  Left Kidney:  Length: 14.6 cm. Echogenicity within normal limits. Moderate hydronephrosis, increased compared to the previous study.  Bladder:  Appears normal for degree of bladder distention.  Prevoid bladder volume measurement is 791 cc. Patient attempted to void, however, postvoid bladder volume is 800 cc.  IMPRESSION: 1. Moderate left-sided hydronephrosis, increased compared to the previous study of 08/26/2014. Consider CT for further characterization. 2. Normal right kidney. 3. Large postvoid residual in the bladder, possibly contributing to the left-sided hydronephrosis.   Electronically Signed   By: Bary RichardStan  Maynard M.D.   On: 11/09/2017 16:26 I have independently reviewed the films.    Assessment & Plan:    1. Hydronephrosis, unspecified hydronephrosis type - may  be due to large bladder residual - creatinine drawn today - Foley in for one week for decompression - Patient will need to increase his CIC in the future - will obtain follow up RUS once bladder has been decompression  2. Urinary retention - Foley in place - RTC for TOV in one week - recheck creatinine at that time - BLADDER SCAN AMB NON-IMAGING  3. CKD - unsure what role the large bladder residual is contributing to his kidney function - will trend creatinine as patient increases his CIC frequency once Foley is out - will repeat the RUS in the future to ensure hydronephrosis has resolved - if not will need further imaging   Return in about 1 week (around 11/19/2017) for TOV .  These notes generated with voice recognition software. I apologize for typographical errors.  Michiel CowboySHANNON Jakyah Bradby, PA-C  [facility] [address], [place], [place] [address] [phone]

## 2017-11-12 NOTE — Progress Notes (Signed)
Simple Catheter Placement  Due to urinary retention patient is present today for a foley cath placement.  Patient was cleaned and prepped in a sterile fashion with betadine and lidocaine jelly 2% was instilled into the urethra.  A 16 coude FR foley catheter was inserted, urine return was noted  900ml, urine was amber in color.  The balloon was filled with 10cc of sterile water.  A leg bag was attached for drainage. Patient was also given a night bag to take home and was given instruction on how to change from one bag to another.  Patient was given instruction on proper catheter care.  Patient tolerated well, no complications were noted   Preformed by: Eligha BridegroomSarah Maxie Debose, CMA   Additional notes/ Follow up: 1 wk TOV

## 2017-11-13 ENCOUNTER — Observation Stay
Admission: RE | Admit: 2017-11-13 | Payer: Managed Care, Other (non HMO) | Source: Ambulatory Visit | Admitting: Nephrology

## 2017-11-13 LAB — CREATININE, SERUM
Creatinine, Ser: 2.2 mg/dL — ABNORMAL HIGH (ref 0.76–1.27)
GFR calc Af Amer: 38 mL/min/{1.73_m2} — ABNORMAL LOW (ref >59)
GFR calc non Af Amer: 33 mL/min/{1.73_m2} — ABNORMAL LOW (ref >59)

## 2017-11-16 ENCOUNTER — Telehealth: Payer: Self-pay

## 2017-11-16 NOTE — Telephone Encounter (Signed)
LMOM- kidney function down. Hope will improve with cath in for a week.

## 2017-11-16 NOTE — Telephone Encounter (Signed)
-----   Message from Harle BattiestShannon A McGowan, PA-C sent at 11/13/2017  7:26 AM EST ----- Please let Mr. Mitchell Doyle know that his kidney function is down, but we hope that it will improve once the catheter has been in for a week.

## 2017-11-19 ENCOUNTER — Encounter: Payer: Self-pay | Admitting: Urology

## 2017-11-19 ENCOUNTER — Ambulatory Visit (INDEPENDENT_AMBULATORY_CARE_PROVIDER_SITE_OTHER): Payer: Managed Care, Other (non HMO) | Admitting: Urology

## 2017-11-19 VITALS — BP 130/77 | HR 75 | Resp 16 | Ht 71.0 in | Wt 182.0 lb

## 2017-11-19 DIAGNOSIS — N183 Chronic kidney disease, stage 3 unspecified: Secondary | ICD-10-CM

## 2017-11-19 DIAGNOSIS — N1330 Unspecified hydronephrosis: Secondary | ICD-10-CM | POA: Diagnosis not present

## 2017-11-19 DIAGNOSIS — R339 Retention of urine, unspecified: Secondary | ICD-10-CM | POA: Diagnosis not present

## 2017-11-19 NOTE — Progress Notes (Signed)
Catheter Removal  Patient is present today for a catheter removal.  10ml of water was drained from the balloon. A 16 FR Coude foley cath was removed from the bladder no complications were noted . Patient tolerated well.  Preformed by: Nydia Boutonhristopher Thompson, CMA

## 2017-11-19 NOTE — Progress Notes (Signed)
11/19/2017 9:34 AM   Christoper AllegraArvis L Basilio Jan 29, 1966 [id number]  Referring provider: Alba CorySowles, Krichna, MD [address] [place], [place] [id number]  No chief complaint on file.   HPI: 52 yo AAM with left hydronephrosis, urinary retention and CKD who presents today for Foley catheter removal and a recheck on his creatinine.  Background history Patient is a 52 year old African-American male referred to us by Dr. Cherylann RatelLateef for left hydronephrosis and a large post void residual in the urinary bladder.  He [facility] after he experienced failed voiding trial while on alpha blockers and dutasteride.   [facility] and was instructed on CIC.  He was then lost to follow up.   He was recently seen by his PCP for a follow-up for left epididymitis.  [facility] and a testicular ultrasound confirmed epididymitis and he was given a course of Levaquin and his symptoms resolved.  His urine culture was positive for E. Coli.   He was referred on to nephrology for his CKD.  A renal ultrasound was obtained on November 09, 2017 and it noted moderate left-sided hydronephrosis, increased compared to the previous study of 08/26/2014. Consider CT for further characterization.  Normal right kidney.  Large postvoid residual in the bladder, possibly contributing to the left-sided hydronephrosis.  He was then referred on to us.   He states that he stopped self cathing approximately 2 months ago.  Prior to that, he was self cathing every 2-3 months.  He has baseline urinary complaints are nocturia and incontinence.  Patient denies any gross hematuria, dysuria or suprapubic/flank pain.  Patient denies any fevers, chills, nausea or vomiting.  His PVR today is > 864 mL.  Current creatinine is 2.3.  He is still taking the tamsulosin and finasteride.  Most recent PSA was 0.8 in 02/2017.    Today, his Foley is removed.  We will recheck his  creatinine today.  Patient denies any gross hematuria, dysuria or suprapubic/flank pain.  Patient denies any fevers, chills, nausea or vomiting.      PMH: Past Medical History:  Diagnosis Date  . Bladder distension   . History of colonoscopy 2014  . Hyperlipidemia   . 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 Other urinary incontinence   . Overweight   . Polyp of colon   . Premature ejaculation   . Vitamin D insufficiency     Surgical History: Past Surgical History:  Procedure Laterality Date  . COLONOSCOPY WITH PROPOFOL N/A 02/02/2017   Procedure: COLONOSCOPY WITH PROPOFOL;  Surgeon: Midge MiniumWohl, Darren, MD;  [facility];  Service: Endoscopy;  Laterality: N/A;  . TONSILLECTOMY    . transurethral incision of bladder neck Bilateral 12/20/2008    Home Medications:  Allergies as of 11/19/2017   No Known Allergies     Medication List        Accurate as of 11/19/17  9:34 AM. Always use your most recent med list.          finasteride 5 MG tablet Commonly known as:  PROSCAR Take 1 tablet (5 mg total) by mouth daily.   tamsulosin 0.4 MG Caps capsule Commonly known as:  FLOMAX TAKE 1 CAPSULE BY MOUTH DAILY   Vitamin D 2000 units tablet Take by mouth.       Allergies: No Known Allergies  Family History: Family History  Problem Relation  Age of Onset  . Hypertension Mother   . Hypertension Father   . Prostate cancer Neg Hx   . Kidney cancer Neg Hx   . Bladder Cancer Neg Hx     Social History:  reports that  has never smoked. he has never used smokeless tobacco. He reports that he does not drink alcohol or use drugs.  ROS: UROLOGY Frequent Urination?: No Hard to postpone urination?: No Burning/pain with urination?: No Get up at night to urinate?: No Leakage of urine?: No Urine stream starts and stops?: No Trouble starting stream?: No Do you have to strain to urinate?: No Blood in urine?: No Urinary tract infection?: No Sexually  transmitted disease?: No Injury to kidneys or bladder?: Yes Painful intercourse?: No Weak stream?: No Erection problems?: No Penile pain?: No  Gastrointestinal Nausea?: No Vomiting?: No Indigestion/heartburn?: No Diarrhea?: No Constipation?: No  Constitutional Fever: No Night sweats?: No Weight loss?: No Fatigue?: No  Skin Skin rash/lesions?: No Itching?: No  Eyes Blurred vision?: No Double vision?: No  Ears/Nose/Throat Sore throat?: No Sinus problems?: No  Hematologic/Lymphatic Swollen glands?: No Easy bruising?: No  Cardiovascular Leg swelling?: No Chest pain?: No  Respiratory Cough?: No Shortness of breath?: No  Endocrine Excessive thirst?: No  Musculoskeletal Back pain?: No Joint pain?: No  Neurological Headaches?: No Dizziness?: No  Psychologic Depression?: No Anxiety?: No  Physical Exam: BP 130/77   Pulse 75   Resp 16   Ht 5\' 11"  (1.803 m)   Wt 182 lb (82.6 kg)   SpO2 98%   BMI 25.38 kg/m   Constitutional: Well nourished. Alert and oriented, No acute distress. HEENT: [place] AT, moist mucus membranes. Trachea midline, no masses. Cardiovascular: No clubbing, cyanosis, or edema. Respiratory: Normal respiratory effort, no increased work of breathing. Skin: No rashes, bruises or suspicious lesions. Lymph: No cervical or inguinal adenopathy. Neurologic: Grossly intact, no focal deficits, moving all 4 extremities. Psychiatric: Normal mood and affect.  Laboratory Data: Lab Results  Component Value Date   WBC 6.5 03/05/2017   HGB 13.8 03/05/2017   HCT 42.8 03/05/2017   MCV 80.9 03/05/2017   PLT 274 03/05/2017    Lab Results  Component Value Date   CREATININE 2.20 (H) 11/12/2017    Lab Results  Component Value Date   PSA 0.8 03/05/2017   PSA 0.82 03/01/2016    No results found for: TESTOSTERONE  Lab Results  Component Value Date   HGBA1C 5.2 03/05/2017    Lab Results  Component Value Date   TSH 1.17 03/01/2016        Component Value Date/Time   CHOL 199 03/05/2017 0928   CHOL 242 (H) 03/01/2015 1050   HDL 48 03/05/2017 0928   HDL 55 03/01/2015 1050   CHOLHDL 4.1 03/05/2017 0928   VLDL 16 03/05/2017 0928   LDLCALC 135 (H) 03/05/2017 0928   LDLCALC 169 (H) 03/01/2015 1050    Lab Results  Component Value Date   AST 19 03/05/2017   Lab Results  Component Value Date   ALT 16 03/05/2017   No components found for: ALKALINEPHOPHATASE No components found for: BILIRUBINTOTAL  No results found for: ESTRADIOL  Urinalysis No results found for: COLORURINE, APPEARANCEUR, LABSPEC, PHURINE, GLUCOSEU, HGBUR, BILIRUBINUR, KETONESUR, PROTEINUR, UROBILINOGEN, NITRITE, LEUKOCYTESUR  I have reviewed the labs.   Pertinent Imaging: Indication: evaluate for testicular flow, pain, N50.819 Testicular pain, unspecified  Comparison: None   Technique: Gray-scale, color Doppler, and spectral waveform tracings of the scrotum.  Findings: The right  testis is of normal echogenicity and measures 2.2 x 4.1 x 2.4 cm. No intratesticular masses are identified. Arterial and venous blood flow are documented in the right testis on color Doppler and spectral Doppler evaluation. The right epididymal head measures 0.6 x 0.4 x 1.0 cm. There is a trace hydrocele.   The left testis is of normal echogenicity and measures 2.3 x 4.0 x 2.6 cm. No intratesticular masses are identified. Arterial and venous blood flow are documented in the left testis on color Doppler and spectral Doppler evaluation. The left epididymal head measures 1.4 x 0.8 x 1.2 cm. The left epididymis is enlarged and hyperemic on color Doppler evaluation. There is a trace hydrocele.    Impression:  1. Findings concerning for left-sided epididymitis. 2. Trace bilateral hydroceles.    Discussed1/18/2019 1:22 PM by Davy Pique, MD, [facility] with Dr. Clenton Pare.  Electronically Reviewed ZO:[id number] Excell Seltzer, MD, [facility] Electronically Reviewed on:09/28/2017 1:23 PM  I have reviewed the images and concur with the above findings.  Electronically Signed WU:JWJX Hyacinth Meeker, MD, [facility] Electronically Signed on:09/28/2017 8:07 PM  Other Result Information  Images not available for scrotal ultrasound  CLINICAL DATA:  Chronic kidney disease, stage III.  EXAM: RENAL / URINARY TRACT ULTRASOUND COMPLETE  COMPARISON:  Renal ultrasound dated 08/26/2014  FINDINGS: Right Kidney:  Length: 10.2 cm. Echogenicity within normal limits. No mass or hydronephrosis visualized.  Left Kidney:  Length: 14.6 cm. Echogenicity within normal limits. Moderate hydronephrosis, increased compared to the previous study.  Bladder:  Appears normal for degree of bladder distention.  Prevoid bladder volume measurement is 791 cc. Patient attempted to void, however, postvoid bladder volume is 800 cc.  IMPRESSION: 1. Moderate left-sided hydronephrosis, increased compared to the previous study of 08/26/2014. Consider CT for further characterization. 2. Normal right kidney. 3. Large postvoid residual in the bladder, possibly contributing to the left-sided hydronephrosis.   Electronically Signed   By: Bary Richard M.D.   On: 11/09/2017 16:26   Assessment & Plan:    1. Left Hydronephrosis - may be due to large bladder residual - creatinine drawn today - patient will need to increase his CIC - will obtain follow up RUS   2. Urinary retention - patient will CIC tid indefinitely due to urinary retention - RTC in 2 weeks to review PVR's  3. CKD - unsure what role the large bladder residual is contributing to his kidney function - will trend creatinine as patient increases his CIC frequency once Foley is out - will repeat the RUS in the future to ensure hydronephrosis has resolved - if not will need further imaging   Return in about 2 weeks (around 12/03/2017) for recheck and to review  PVR's .  These notes generated with voice recognition software. I apologize for typographical errors.  Michiel Cowboy, PA-C  [facility] [address] [place], [place] [address] [phone]

## 2017-11-20 LAB — CREATININE, SERUM
Creatinine, Ser: 2.46 mg/dL — ABNORMAL HIGH (ref 0.76–1.27)
GFR calc Af Amer: 34 mL/min/{1.73_m2} — ABNORMAL LOW (ref >59)
GFR calc non Af Amer: 29 mL/min/{1.73_m2} — ABNORMAL LOW (ref >59)

## 2017-11-21 ENCOUNTER — Telehealth: Payer: Self-pay | Admitting: Urology

## 2017-11-21 NOTE — Telephone Encounter (Signed)
He needs another one.

## 2017-11-21 NOTE — Telephone Encounter (Signed)
You ordered a RUS for th is patient but he had one on 11-09-17 that Dr. Cherylann RatelLateef ordered can you use this one or does he need to have another one done?  Please advise  Mitchell DusterMichelle

## 2017-11-26 ENCOUNTER — Telehealth: Payer: Self-pay | Admitting: Urology

## 2017-11-26 NOTE — Telephone Encounter (Signed)
Left message for patient to call me back  Michelle 

## 2017-11-26 NOTE — Telephone Encounter (Signed)
Spoke with patient and he was transferred over to scheduling to get this scheduled  Mitchell DusterMichelle

## 2017-11-28 ENCOUNTER — Ambulatory Visit
Admission: RE | Admit: 2017-11-28 | Discharge: 2017-11-28 | Disposition: A | Discharge status: Home / Self Care | Payer: Managed Care, Other (non HMO) | Source: Ambulatory Visit | Attending: Urology | Admitting: Urology

## 2017-11-28 DIAGNOSIS — N1330 Unspecified hydronephrosis: Secondary | ICD-10-CM | POA: Insufficient documentation

## 2017-12-03 ENCOUNTER — Encounter: Payer: Self-pay | Admitting: Urology

## 2017-12-03 ENCOUNTER — Other Ambulatory Visit: Payer: Self-pay

## 2017-12-03 ENCOUNTER — Ambulatory Visit (INDEPENDENT_AMBULATORY_CARE_PROVIDER_SITE_OTHER): Payer: Managed Care, Other (non HMO) | Admitting: Urology

## 2017-12-03 VITALS — BP 143/83 | HR 64 | Ht 71.0 in | Wt 190.6 lb

## 2017-12-03 DIAGNOSIS — N3289 Other specified disorders of bladder: Secondary | ICD-10-CM

## 2017-12-03 DIAGNOSIS — N183 Chronic kidney disease, stage 3 unspecified: Secondary | ICD-10-CM

## 2017-12-03 DIAGNOSIS — N1330 Unspecified hydronephrosis: Secondary | ICD-10-CM

## 2017-12-03 DIAGNOSIS — R339 Retention of urine, unspecified: Secondary | ICD-10-CM | POA: Diagnosis not present

## 2017-12-03 NOTE — Progress Notes (Signed)
12/03/2017 9:32 AM   Mitchell Doyle 01-10-1966 [id number]  Referring provider: Alba CorySowles, Krichna, MD [address] [place], [place] [id number]  Chief Complaint  Patient presents with  . Hydronephrosis    pt concern that he cannot urinate on his own w/o self-cath. He's concern because post cath he was able to urinate on his own.    HPI: 52 yo AAM with left hydronephrosis, urinary retention and CKD who presents today to discuss his RUS results to review his PVR's.    Background history Patient is a 52 year old African-American male referred to us by Dr. Cherylann RatelLateef for left hydronephrosis and a large post void residual in the urinary bladder.  He underwent [facility]UR [facility] the bladder [facility] 12[facility]21/[facility] after he experienced failed voiding trial while on alpha blockers and dutasteride.   [facility] and was instructed on CIC.  He was then lost to follow up.  He was recently seen by his PCP for a follow-up for left epididymitis.  [facility] and a testicular ultrasound confirmed epididymitis and he was given a course of Levaquin and his symptoms resolved.  His urine culture was positive for E. Coli.   He was referred on to nephrology for his CKD.  A renal ultrasound was obtained on November 09, 2017 and it noted moderate left-sided hydronephrosis, increased compared to the previous study of 08/26/2014. Consider CT for further characterization.  Normal right kidney.  Large postvoid residual in the bladder, possibly contributing to the left-sided hydronephrosis.  He was then referred on to us.   He states that he stopped self cathing approximately 2 months ago.  Prior to that, he was self cathing every 2-3 months.  He has baseline urinary complaints are nocturia and incontinence.  Patient denies any gross hematuria, dysuria or suprapubic/flank pain.  Patient denies any fevers, chills, nausea or vomiting.  On 11/12/2017, his PVR was > 864 mL.  Current  creatinine is 2.3.  He is still taking the tamsulosin and finasteride.  Most recent PSA was 0.8 in 02/2017.    On 11/19/2017, his Foley is removed.  We will recheck his creatinine today.  Patient denies any gross hematuria, dysuria or suprapubic/flank pain.  Patient denies any fevers, chills, nausea or vomiting.     His repeated creatinine was 2.46.  His repeated RUS performed on 11/28/2017 noted a decrease in his left-sided hydronephrosis. Bilateral ureteral jets are observed though the left jet is weaker than the right. Mildly increased renal cortical echoes texture bilaterally still slightly lower than that of the adjacent liver. This is compatible with medical renal disease.  Thick walled partially distended urinary bladder.    He states he is currently cathing 4-5 times daily.  His residuals have been 300 cc or less.  He did have one instance where his PVR was 600 cc, but he attributes this to waiting too long to cath himself.  Patient denies any gross hematuria, dysuria or suprapubic/flank pain.  Patient denies any fevers, chills, nausea or vomiting.  He is currently using a 1434f coude red robison catheter for self cathing as he finds this the most comfortable for him.     PMH: Past Medical History:  Diagnosis Date  . Bladder distension   . History of colonoscopy 2014  . Hyperlipidemia   . 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 Other urinary incontinence   . Overweight   .  Polyp of colon   . Premature ejaculation   . Vitamin D insufficiency     Surgical History: Past Surgical History:  Procedure Laterality Date  . COLONOSCOPY WITH PROPOFOL N/A 02/02/2017   Procedure: COLONOSCOPY WITH PROPOFOL;  Surgeon: Midge Minium, MD;  [facility];  Service: Endoscopy;  Laterality: N/A;  . TONSILLECTOMY    . transurethral incision of bladder neck Bilateral 12/20/2008    Home Medications:  Allergies as of 12/03/2017   No Known Allergies     Medication List          Accurate as of 12/03/17  9:32 AM. Always use your most recent med list.          finasteride 5 MG tablet Commonly known as:  PROSCAR Take 1 tablet (5 mg total) by mouth daily.   tamsulosin 0.4 MG Caps capsule Commonly known as:  FLOMAX TAKE 1 CAPSULE BY MOUTH DAILY   Vitamin D 2000 units tablet Take by mouth.       Allergies: No Known Allergies  Family History: Family History  Problem Relation Age of Onset  . Hypertension Mother   . Hypertension Father   . Prostate cancer Neg Hx   . Kidney cancer Neg Hx   . Bladder Cancer Neg Hx     Social History:  reports that he has never smoked. He has never used smokeless tobacco. He reports that he does not drink alcohol or use drugs.  ROS: UROLOGY Frequent Urination?: No Hard to postpone urination?: No Burning/pain with urination?: No Get up at night to urinate?: No Leakage of urine?: No Urine stream starts and stops?: No Trouble starting stream?: No Do you have to strain to urinate?: No Blood in urine?: No Urinary tract infection?: No Sexually transmitted disease?: No Injury to kidneys or bladder?: No Painful intercourse?: No Weak stream?: No Erection problems?: No Penile pain?: No  Gastrointestinal Nausea?: No Vomiting?: No Indigestion/heartburn?: No Diarrhea?: No Constipation?: No  Constitutional Fever: No Night sweats?: No Weight loss?: No Fatigue?: No  Skin Skin rash/lesions?: No Itching?: No  Eyes Blurred vision?: No Double vision?: No  Ears/Nose/Throat Sore throat?: No Sinus problems?: No  Hematologic/Lymphatic Swollen glands?: No Easy bruising?: No  Cardiovascular Leg swelling?: No Chest pain?: No  Respiratory Cough?: No Shortness of breath?: No  Endocrine Excessive thirst?: No  Musculoskeletal Back pain?: No Joint pain?: No  Neurological Headaches?: No Dizziness?: No  Psychologic Depression?: No Anxiety?: No  Physical Exam: BP (!) 143/83   Pulse 64   Ht  5\' 11"  (1.803 m)   Wt 190 lb 9.6 oz (86.5 kg)   BMI 26.58 kg/m   Constitutional: Well nourished. Alert and oriented, No acute distress. HEENT: [place] AT, moist mucus membranes. Trachea midline, no masses. Cardiovascular: No clubbing, cyanosis, or edema. Respiratory: Normal respiratory effort, no increased work of breathing. Skin: No rashes, bruises or suspicious lesions. Lymph: No cervical or inguinal adenopathy. Neurologic: Grossly intact, no focal deficits, moving all 4 extremities. Psychiatric: Normal mood and affect.   Laboratory Data: Lab Results  Component Value Date   WBC 6.5 03/05/2017   HGB 13.8 03/05/2017   HCT 42.8 03/05/2017   MCV 80.9 03/05/2017   PLT 274 03/05/2017    Lab Results  Component Value Date   CREATININE 2.46 (H) 11/19/2017    Lab Results  Component Value Date   PSA 0.8 03/05/2017   PSA 0.82 03/01/2016    No results found for: TESTOSTERONE  Lab Results  Component Value Date  HGBA1C 5.2 03/05/2017    Lab Results  Component Value Date   TSH 1.17 03/01/2016       Component Value Date/Time   CHOL 199 03/05/2017 0928   CHOL 242 (H) 03/01/2015 1050   HDL 48 03/05/2017 0928   HDL 55 03/01/2015 1050   CHOLHDL 4.1 03/05/2017 0928   VLDL 16 03/05/2017 0928   LDLCALC 135 (H) 03/05/2017 0928   LDLCALC 169 (H) 03/01/2015 1050    Lab Results  Component Value Date   AST 19 03/05/2017   Lab Results  Component Value Date   ALT 16 03/05/2017   No components found for: ALKALINEPHOPHATASE No components found for: BILIRUBINTOTAL  No results found for: ESTRADIOL  Urinalysis No results found for: COLORURINE, APPEARANCEUR, LABSPEC, PHURINE, GLUCOSEU, HGBUR, BILIRUBINUR, KETONESUR, PROTEINUR, UROBILINOGEN, NITRITE, LEUKOCYTESUR  I have reviewed the labs.   Pertinent Imaging: CLINICAL DATA:  Follow-up hydronephrosis. The patient had an indwelling catheter for 2 weeks and now is doing in and out catheterization.  EXAM: RENAL / URINARY  TRACT ULTRASOUND COMPLETE  COMPARISON:  Renal ultrasound of November 09, 2017  FINDINGS: Right Kidney:  Length: 10.6 cm. The renal cortical echotexture remains slightly lower than that of the liver but appears abnormal. The cortical contour is somewhat lobulated. There is no hydronephrosis.  Left Kidney:  Length: 8.9 cm. The reach the renal cortical echotexture is mildly increased similar to that on the right. There is mild dilation of the upper and lower pole calices. The prominent hydronephrosis seen previously is not evident today.  Bladder:  The urinary bladder wall is irregularly thickened. It measures as much as 1.2 cm in thickness. Bilateral ureteral jets are observed with the right jet more vigorous than the left.  IMPRESSION: Decreased left-sided hydronephrosis. Bilateral ureteral jets are observed though the left jet is weaker than the right. Mildly increased renal cortical echoes texture bilaterally still slightly lower than that of the adjacent liver. This is compatible with medical renal disease.  Thick walled partially distended urinary bladder.   Electronically Signed   By: David  [place] M.D.   On: 11/29/2017 07:29  I have independently reviewed the films  Assessment & Plan:    1. Left Hydronephrosis - somewhat improved - will obtain a non contrast CT for further evaluation as serum creatinine is above 2 and it is unsafe to use contrast at this time  2. Urinary retention - patient will CIC five times daily indefinitely due to urinary retention - script faxed to [employer] at [phone] - has a history of a bladder neck contracture  -schedule cystoscopy to evaluate for BOO and/or strictures  3. Irregularly thickened bladder Patient has been CIC intermittently since 2016 and had not had a cystoscopy since his procedure in 2016 Schedule cystoscopy   3. CKD Followed by Dr. Cherylann Ratel  Return for CT report and cystoscopy .  These notes generated  with voice recognition software. I apologize for typographical errors.  Michiel Cowboy, PA-C  [facility] [address], [address] [place], [place] [address] [phone]

## 2017-12-11 ENCOUNTER — Other Ambulatory Visit: Payer: Self-pay | Admitting: Urology

## 2017-12-11 DIAGNOSIS — N1339 Other hydronephrosis: Secondary | ICD-10-CM

## 2017-12-11 NOTE — Progress Notes (Signed)
Orders in for a w/wo CT.

## 2017-12-20 ENCOUNTER — Ambulatory Visit
Admission: RE | Admit: 2017-12-20 | Discharge: 2017-12-20 | Disposition: A | Discharge status: Home / Self Care | Payer: Managed Care, Other (non HMO) | Source: Ambulatory Visit | Attending: Urology | Admitting: Urology

## 2017-12-20 ENCOUNTER — Encounter: Payer: Self-pay | Admitting: Urology

## 2017-12-20 DIAGNOSIS — R911 Solitary pulmonary nodule: Secondary | ICD-10-CM | POA: Insufficient documentation

## 2017-12-20 DIAGNOSIS — N400 Enlarged prostate without lower urinary tract symptoms: Secondary | ICD-10-CM | POA: Diagnosis not present

## 2017-12-20 DIAGNOSIS — I700 Atherosclerosis of aorta: Secondary | ICD-10-CM | POA: Insufficient documentation

## 2017-12-20 DIAGNOSIS — N132 Hydronephrosis with renal and ureteral calculous obstruction: Secondary | ICD-10-CM | POA: Insufficient documentation

## 2017-12-20 DIAGNOSIS — N1330 Unspecified hydronephrosis: Secondary | ICD-10-CM | POA: Diagnosis present

## 2017-12-20 DIAGNOSIS — N261 Atrophy of kidney (terminal): Secondary | ICD-10-CM | POA: Diagnosis not present

## 2017-12-20 LAB — POCT I-STAT CREATININE: Creatinine, Ser: 2.4 mg/dL — ABNORMAL HIGH (ref 0.61–1.24)

## 2017-12-20 MED ORDER — IOPAMIDOL (ISOVUE-300) INJECTION 61%
100.0000 mL | Freq: Once | INTRAVENOUS | Status: AC | PRN
  Administered 2017-12-20: 60 mL via INTRAVENOUS

## 2017-12-26 ENCOUNTER — Encounter: Payer: Self-pay | Admitting: Urology

## 2017-12-26 ENCOUNTER — Ambulatory Visit (INDEPENDENT_AMBULATORY_CARE_PROVIDER_SITE_OTHER): Payer: Managed Care, Other (non HMO) | Admitting: Urology

## 2017-12-26 VITALS — BP 151/88 | HR 69 | Resp 16 | Ht 71.0 in | Wt 188.5 lb

## 2017-12-26 DIAGNOSIS — R339 Retention of urine, unspecified: Secondary | ICD-10-CM | POA: Diagnosis not present

## 2017-12-26 LAB — URINALYSIS, COMPLETE
BILIRUBIN UA: NEGATIVE
Glucose, UA: NEGATIVE
KETONES UA: NEGATIVE
Nitrite, UA: POSITIVE — AB
SPEC GRAV UA: 1.015 (ref 1.005–1.030)
UUROB: 0.2 mg/dL (ref 0.2–1.0)
pH, UA: 5.5 (ref 5.0–7.5)

## 2017-12-26 LAB — MICROSCOPIC EXAMINATION: Epithelial Cells (non renal): NONE SEEN /hpf (ref 0–10)

## 2017-12-26 MED ORDER — CIPROFLOXACIN HCL 500 MG PO TABS
500.0000 mg | ORAL_TABLET | Freq: Once | ORAL | Status: AC
  Administered 2017-12-26: 500 mg via ORAL

## 2017-12-26 MED ORDER — LIDOCAINE HCL URETHRAL/MUCOSAL 2 % EX GEL
1.0000 "application " | Freq: Once | CUTANEOUS | Status: AC
  Administered 2017-12-26: 1 via URETHRAL

## 2017-12-26 NOTE — Progress Notes (Signed)
   12/26/17  CC: No chief complaint on file.   HPI: Refer to [employer] Doyle's note of 12/11/2017.  CT showed mild left hydronephrosis/hydroureter to the level of the UVJ.  There was bilateral renal cortical scarring noted and punctate nonobstructing left renal calculi.  Blood pressure (!) 151/88, pulse 69, resp. rate 16, height 5\' 11"  (1.803 m), weight 188 lb 8 oz (85.5 kg), SpO2 98 %. NED. A&Ox3.   No respiratory distress   Abd soft, NT, ND Normal phallus with bilateral descended testicles  Cystoscopy Procedure Note  Patient identification was confirmed, informed consent was obtained, and patient was prepped using Betadine solution.  Lidocaine jelly was administered per urethral meatus.    Preoperative abx where received prior to procedure.     Pre-Procedure: - Inspection reveals a normal caliber ureteral meatus.  Procedure: The flexible cystoscope was introduced without difficulty - No urethral strictures/lesions are present. - Mild lateral lobe prostate enlargement - Open bladder neck - Bilateral ureteral orifices not identified - Bladder mucosa  reveals no solid or papillary tumors.  Thickened bladder wall with erythema at base secondary to CIC - No bladder stones -Moderate trabeculation  Retroflexion shows no intravesical median lobe   Post-Procedure: - Patient tolerated the procedure well  Assessment/ Plan: No evidence of urethral stricture or bladder neck contracture.  Mild left hydronephrosis/hydroureter which could be secondary to reflux.  Recommend scheduling a video urodynamic study.

## 2017-12-27 ENCOUNTER — Encounter: Payer: Self-pay | Admitting: Urology

## 2018-01-21 ENCOUNTER — Other Ambulatory Visit: Payer: Self-pay | Admitting: Family Medicine

## 2018-01-21 DIAGNOSIS — N138 Other obstructive and reflux uropathy: Secondary | ICD-10-CM

## 2018-01-21 DIAGNOSIS — N401 Enlarged prostate with lower urinary tract symptoms: Principal | ICD-10-CM

## 2018-01-30 ENCOUNTER — Other Ambulatory Visit: Payer: Self-pay | Admitting: Urology

## 2018-02-05 ENCOUNTER — Encounter: Payer: Self-pay | Admitting: Urology

## 2018-02-05 ENCOUNTER — Ambulatory Visit (INDEPENDENT_AMBULATORY_CARE_PROVIDER_SITE_OTHER): Payer: Managed Care, Other (non HMO) | Admitting: Urology

## 2018-02-05 VITALS — BP 143/82 | HR 73 | Ht 71.0 in | Wt 187.4 lb

## 2018-02-05 DIAGNOSIS — N312 Flaccid neuropathic bladder, not elsewhere classified: Secondary | ICD-10-CM | POA: Diagnosis not present

## 2018-02-05 DIAGNOSIS — R339 Retention of urine, unspecified: Secondary | ICD-10-CM | POA: Diagnosis not present

## 2018-02-05 LAB — URINALYSIS, COMPLETE
Bilirubin, UA: NEGATIVE
Glucose, UA: NEGATIVE
Ketones, UA: NEGATIVE
Nitrite, UA: POSITIVE — AB
PH UA: 5 (ref 5.0–7.5)
PROTEIN UA: NEGATIVE
RBC, UA: NEGATIVE
Specific Gravity, UA: 1.01 (ref 1.005–1.030)
Urobilinogen, Ur: 0.2 mg/dL (ref 0.2–1.0)

## 2018-02-05 LAB — MICROSCOPIC EXAMINATION: Epithelial Cells (non renal): NONE SEEN /hpf (ref 0–10)

## 2018-02-05 NOTE — Progress Notes (Signed)
02/05/2018 9:05 AM   Mitchell Doyle 08-07-1966 [id number]  Referring provider: Alba Cory, MD [address] [place], [place] [address]  Chief Complaint  Patient presents with  . Hydronephrosis    HPI: 52 year old male with a history of urinary retention and prior TUR bladder neck at [facility] presents for review of his urodynamic study.  The study was performed on 01/29/2018.  Bladder capacity was 1100 mL.  There was loss of compliance.  Maximum detrusor pressure was 73 cm of water and he was only able to void 13 mL.   PMH: Past Medical History:  Diagnosis Date  . Bladder distension   . History of colonoscopy 2014  . Hyperlipidemia   . 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 Other urinary incontinence   . Overweight   . Polyp of colon   . Premature ejaculation   . Vitamin D insufficiency     Surgical History: Past Surgical History:  Procedure Laterality Date  . COLONOSCOPY WITH PROPOFOL N/A 02/02/2017   Procedure: COLONOSCOPY WITH PROPOFOL;  Surgeon: Midge Minium, MD;  Location: [facility];  Service: Endoscopy;  Laterality: N/A;  . TONSILLECTOMY    . transurethral incision of bladder neck Bilateral 12/20/2008    Home Medications:  Allergies as of 02/05/2018   No Known Allergies     Medication List        Accurate as of 02/05/18  9:05 AM. Always use your most recent med list.          finasteride 5 MG tablet Commonly known as:  PROSCAR TAKE 1 TABLET BY MOUTH (  TOTAL) DAILY   tamsulosin 0.4 MG Caps capsule Commonly known as:  FLOMAX TAKE 1 CAPSULE BY MOUTH DAILY   Vitamin D 2000 units tablet Take by mouth.       Allergies: No Known Allergies  Family History: Family History  Problem Relation Age of Onset  . Hypertension Mother   . Hypertension Father   . Prostate cancer Neg Hx   . Kidney cancer Neg Hx   . Bladder Cancer Neg Hx     Social History:  reports that he has never smoked. He has never used  smokeless tobacco. He reports that he does not drink alcohol or use drugs.  ROS: UROLOGY Frequent Urination?: No Hard to postpone urination?: No Burning/pain with urination?: No Get up at night to urinate?: No Leakage of urine?: No Urine stream starts and stops?: No Trouble starting stream?: No Do you have to strain to urinate?: No Blood in urine?: No Urinary tract infection?: No Sexually transmitted disease?: No Injury to kidneys or bladder?: No Painful intercourse?: No Weak stream?: No Erection problems?: No Penile pain?: No  Gastrointestinal Nausea?: No Vomiting?: No Indigestion/heartburn?: No Diarrhea?: No Constipation?: No  Constitutional Fever: No Night sweats?: No Weight loss?: No Fatigue?: No  Skin Skin rash/lesions?: No Itching?: No  Eyes Blurred vision?: No Double vision?: No  Ears/Nose/Throat Sore throat?: No Sinus problems?: No  Hematologic/Lymphatic Swollen glands?: No Easy bruising?: No  Cardiovascular Leg swelling?: No Chest pain?: No  Respiratory Cough?: No Shortness of breath?: No  Endocrine Excessive thirst?: No  Musculoskeletal Back pain?: No Joint pain?: No  Neurological Headaches?: No Dizziness?: No  Psychologic Depression?: No Anxiety?: No  Physical Exam: BP (!) 143/82   Pulse 73   Ht  (1.803 m)   Wt 187 lb 6.4 oz (85 kg)   BMI 26.14 kg/m   Constitutional:  Alert and oriented,  No acute distress. HEENT: [place] AT, moist mucus membranes.  Trachea midline, no masses. Cardiovascular: No clubbing, cyanosis, or edema. Respiratory: Normal respiratory effort, no increased work of breathing. GI: Abdomen is soft, nontender, nondistended, no abdominal masses GU: No CVA tenderness Lymph: No cervical or inguinal lymphadenopathy. Skin: No rashes, bruises or suspicious lesions. Neurologic: Grossly intact, no focal deficits, moving all 4 extremities. Psychiatric: Normal mood and affect.   Assessment & Plan:     52 year old male with a large capacity, hypotonic detrusor.  He did generate a sustained bladder contraction however was unable to void.  We will have him continue intermittent catheterization.  His recent cystoscopy showed no stricture or fixed obstruction point.  Will discuss with Dr. Sherron Monday.   Riki Altes, MD  [facility] [address] [place], [place] [address] [phone]

## 2018-02-08 ENCOUNTER — Encounter: Payer: Self-pay | Admitting: Family Medicine

## 2018-03-08 ENCOUNTER — Ambulatory Visit (INDEPENDENT_AMBULATORY_CARE_PROVIDER_SITE_OTHER): Payer: Managed Care, Other (non HMO) | Admitting: Family Medicine

## 2018-03-08 ENCOUNTER — Encounter: Payer: Self-pay | Admitting: Family Medicine

## 2018-03-08 VITALS — BP 112/72 | HR 89 | Temp 98.2°F | Resp 16 | Ht 71.0 in | Wt 187.0 lb

## 2018-03-08 DIAGNOSIS — N401 Enlarged prostate with lower urinary tract symptoms: Secondary | ICD-10-CM

## 2018-03-08 DIAGNOSIS — Z131 Encounter for screening for diabetes mellitus: Secondary | ICD-10-CM

## 2018-03-08 DIAGNOSIS — N1330 Unspecified hydronephrosis: Secondary | ICD-10-CM | POA: Diagnosis not present

## 2018-03-08 DIAGNOSIS — Z789 Other specified health status: Secondary | ICD-10-CM

## 2018-03-08 DIAGNOSIS — Z13220 Encounter for screening for lipoid disorders: Secondary | ICD-10-CM | POA: Diagnosis not present

## 2018-03-08 DIAGNOSIS — N138 Other obstructive and reflux uropathy: Secondary | ICD-10-CM

## 2018-03-08 DIAGNOSIS — Z87438 Personal history of other diseases of male genital organs: Secondary | ICD-10-CM

## 2018-03-08 DIAGNOSIS — N183 Chronic kidney disease, stage 3 unspecified: Secondary | ICD-10-CM

## 2018-03-08 DIAGNOSIS — N2581 Secondary hyperparathyroidism of renal origin: Secondary | ICD-10-CM | POA: Diagnosis not present

## 2018-03-08 DIAGNOSIS — E559 Vitamin D deficiency, unspecified: Secondary | ICD-10-CM

## 2018-03-08 DIAGNOSIS — Z0001 Encounter for general adult medical examination with abnormal findings: Secondary | ICD-10-CM | POA: Diagnosis not present

## 2018-03-08 DIAGNOSIS — Z0000 Encounter for general adult medical examination without abnormal findings: Secondary | ICD-10-CM

## 2018-03-08 MED ORDER — FISH OIL 1000 MG PO CAPS: 1.0000 | ORAL_CAPSULE | Freq: Two times a day (BID) | ORAL | 0 refills | Status: AC

## 2018-03-08 NOTE — Progress Notes (Signed)
Name: Mitchell Doyle   MRN: [id number]    DOB: 1966/07/22   Date:03/08/2018       Progress Note  Subjective  Chief Complaint  Chief Complaint  Patient presents with  . Annual Exam    patient stated that he eats a well balance diet, not many snacks (snickers 1/week). sleeps about 7 hours/ night.     HPI  Patient presents for annual CPE and follow up  CKI stage III: seeing Dr. Cherylann Ratel, he discussed considering ACE and ARB and patient will research it, he also wants to have labs every 6 months. No itching or rashes, he selfs cath because of urinary retention and BPH. Taking medication as prescribed, self caths 3-4 times daily, still voids on his own in the mornings.   Elevated BP: past two visits with sub-specialists but back to normal today, no chest pan or palpitation.   USPSTF grade A and B recommendations:  Diet: eats healthy  Exercise: continue physical activity   Depression:  Depression screen [facility] 2/9 03/08/2018 10/12/2017 08/31/2016 03/01/2016 03/01/2015  Decreased Interest 0 0 0 0 0  Down, Depressed, Hopeless 0 0 0 0 0  PHQ - 2 Score 0 0 0 0 0    Hypertension:  BP Readings from Last 3 Encounters:  03/08/18 112/72  02/05/18 (!) 143/82  12/26/17 (!) 151/88    Obesity: Wt Readings from Last 3 Encounters:  03/08/18 187 lb (84.8 kg)  02/05/18 187 lb 6.4 oz (85 kg)  12/26/17 188 lb 8 oz (85.5 kg)   BMI Readings from Last 3 Encounters:  03/08/18 26.08 kg/m  02/05/18 26.14 kg/m  12/26/17 26.29 kg/m     Lipids:  Lab Results  Component Value Date   CHOL 199 03/05/2017   CHOL 191 03/01/2016   CHOL 242 (H) 03/01/2015   Lab Results  Component Value Date   HDL 48 03/05/2017   HDL 58 03/01/2016   HDL 55 03/01/2015   Lab Results  Component Value Date   LDLCALC 135 (H) 03/05/2017   LDLCALC 123 03/01/2016   LDLCALC 169 (H) 03/01/2015   Lab Results  Component Value Date   TRIG 79 03/05/2017   TRIG 49 03/01/2016   TRIG 88 03/01/2015   Lab Results  Component  Value Date   CHOLHDL 4.1 03/05/2017   CHOLHDL 3.3 03/01/2016   CHOLHDL 4.4 03/01/2015   No results found for: LDLDIRECT Glucose:  Glucose, Bld  Date Value Ref Range Status  03/05/2017 74 65 - 99 mg/dL Final  40/98/1191 92 65 - 99 mg/dL Final      Married STD testing and prevention (chl/gon/syphilis): N/A HIV, hep C: Not interested   Skin cancer: discussed atypical lesions  Colorectal cancer: up to date  Prostate cancer: recheck PSA today   Lab Results  Component Value Date   PSA 0.8 03/05/2017   PSA 0.82 03/01/2016    IPSS Questionnaire (AUA-7): Over the past month.   1)  How often have you had a sensation of not emptying your bladder completely after you finish urinating?  0 - Not at all  2)  How often have you had to urinate again less than two hours after you finished urinating? 0 - Not at all  3)  How often have you found you stopped and started again several times when you urinated?  0 - Not at all  4) How difficult have you found it to postpone urination?  0 - Not at all  5) How often have  you had a weak urinary stream?  5 - Almost always  6) How often have you had to push or strain to begin urination?  0 - Not at all  7) How many times did you most typically get up to urinate from the time you went to bed until the time you got up in the morning?  0 - None  Total score:  0-7 mildly symptomatic   8-19 moderately symptomatic   20-35 severely symptomatic   Lung cancer:  Low Dose CT Chest recommended if Age 65-80 years, 30 pack-year currently smoking OR have quit w/in 15years. Patient does not qualify.     Aspirin: not interested.  ECG:  Today   Advanced Care Planning: A voluntary discussion about advance care planning including the explanation and discussion of advance directives.  Discussed health care proxy and Living will, and the patient was able to identify a health care proxy as wife   Patient does not have a living will at present time. Patient Active  Problem List   Diagnosis Date Noted  . Hydronephrosis, left 03/08/2018  . Secondary hyperparathyroidism of renal origin (HCC) 03/08/2018  . Intermittent self-catheterization of bladder 03/08/2018  . History of epididymitis 03/08/2018  . Chronic kidney disease (CKD), stage III (moderate) (HCC) 03/06/2017  . Internal hemorrhoids without complication 03/05/2017  . Personal history of colonic polyps   . Benign prostatic hyperplasia with urinary obstruction 02/27/2015  . History of urinary retention 02/27/2015  . Vitamin D deficiency 08/10/2009  . Dyslipidemia 02/11/2007    Past Surgical History:  Procedure Laterality Date  . COLONOSCOPY WITH PROPOFOL N/A 02/02/2017   Procedure: COLONOSCOPY WITH PROPOFOL;  Surgeon: Midge Minium, MD;  Location: [facility];  Service: Endoscopy;  Laterality: N/A;  . TONSILLECTOMY    . transurethral incision of bladder neck Bilateral 12/20/2008    Family History  Problem Relation Age of Onset  . Hypertension Mother   . Hypertension Father   . Prostate cancer Neg Hx   . Kidney cancer Neg Hx   . Bladder Cancer Neg Hx     Social History   Socioeconomic History  . Marital status: Married    Spouse name: Vance Gather  . Number of children: 3  . Years of education: Not on file  . Highest education level: Bachelor's degree (e.g., BA, AB, BS)  Occupational History  . Not on file  Social Needs  . Financial resource strain: Not hard at all  . Food insecurity:    Worry: Never true    Inability: Never true  . Transportation needs:    Medical: No    Non-medical: No  Tobacco Use  . Smoking status: Never Smoker  . Smokeless tobacco: Never Used  Substance and Sexual Activity  . Alcohol use: No    Alcohol/week: 0.0 oz  . Drug use: No  . Sexual activity: Yes    Partners: Female    Birth control/protection: None  Lifestyle  . Physical activity:    Days per week: 4 days    Minutes per session: 50 min  . Stress: Not at all  Relationships  .  Social connections:    Talks on phone: More than three times a week    Gets together: More than three times a week    Attends religious service: More than 4 times per year    Active member of club or organization: No    Attends meetings of clubs or organizations: Never    Relationship status: Married  .  Intimate partner violence:    Fear of current or ex partner: Not on file    Emotionally abused: Not on file    Physically abused: Not on file    Forced sexual activity: Not on file  Other Topics Concern  . Not on file  Social History Narrative   Patient stated that he worries about his boys (53, 54 & 52)     Current Outpatient Medications:  .  Cholecalciferol (VITAMIN D) 2000 UNITS tablet, Take by mouth., Disp: , Rfl:  .  Cyanocobalamin (VITAMIN B-12 PO), Take by mouth., Disp: , Rfl:  .  finasteride (PROSCAR) 5 MG tablet, TAKE 1 TABLET BY MOUTH (5MG  TOTAL) DAILY, Disp: 90 tablet, Rfl: 3 .  MAGNESIUM OXIDE PO, Take by mouth., Disp: , Rfl:  .  tamsulosin (FLOMAX) 0.4 MG CAPS capsule, TAKE 1 CAPSULE BY MOUTH DAILY, Disp: 90 capsule, Rfl: 3 .  Omega-3 Fatty Acids (FISH OIL) 1000 MG CAPS, Take 1 capsule (1,000 mg total) by mouth 2 (two) times daily., Disp: 60 capsule, Rfl: 0  No Known Allergies   ROS   Constitutional: Negative for fever or weight change.  Respiratory: Negative for cough and shortness of breath.   Cardiovascular: Negative for chest pain or palpitations.  Gastrointestinal: Negative for abdominal pain, no bowel changes.  Musculoskeletal: Negative for gait problem or joint swelling.  Skin: Negative for rash.  Neurological: Negative for dizziness or headache.  No other specific complaints in a complete review of systems (except as listed in HPI above).   Objective  Vitals:   03/08/18 0837  BP: 112/72  Pulse: 89  Resp: 16  Temp: 98.2 F (36.8 C)  TempSrc: Oral  SpO2: 99%  Weight: 187 lb (84.8 kg)  Height: 5\' 11"  (1.803 m)    Body mass index is 26.08  kg/m.  Physical Exam  Constitutional: Patient appears well-developed and slightly elevated.  No distress.  HENT: Head: Normocephalic and atraumatic. Ears: B TMs ok, no erythema or effusion; Nose: Nose normal. Mouth/Throat: Oropharynx is clear and moist. No oropharyngeal exudate.  Eyes: Conjunctivae and EOM are normal. Pupils are equal, round, and reactive to light. No scleral icterus.  Neck: Normal range of motion. Neck supple. No JVD present. No thyromegaly present.  Cardiovascular: Normal rate, regular rhythm and normal heart sounds.  No murmur heard. No BLE edema. Pulmonary/Chest: Effort normal and breath sounds normal. No respiratory distress. Abdominal: Soft. Bowel sounds are normal, no distension. There is no tenderness. no masses MALE GENITALIA: Not done, sees Urologist  RECTAL: not done  Musculoskeletal: Normal range of motion, no joint effusions. No gross deformities Neurological: he is alert and oriented to person, place, and time. No cranial nerve deficit. Coordination, balance, strength, speech and gait are normal.  Skin: Skin is warm and dry. No rash noted. No erythema.  Psychiatric: Patient has a normal mood and affect. behavior is normal. Judgment and thought content normal.  Recent Results (from the past 2160 hour(s))  I-STAT creatinine     Status: Abnormal   Collection Time: 12/20/17 11:37 AM  Result Value Ref Range   Creatinine, Ser 2.40 (H) 0.61 - 1.24 mg/dL  Urinalysis, Complete     Status: Abnormal   Collection Time: 12/26/17 10:18 AM  Result Value Ref Range   Specific Gravity, UA 1.015 1.005 - 1.030   pH, UA 5.5 5.0 - 7.5   Color, UA Yellow Yellow   Appearance Ur Cloudy (A) Clear   Leukocytes, UA 3+ (A) Negative  Protein, UA 1+ (A) Negative/Trace   Glucose, UA Negative Negative   Ketones, UA Negative Negative   RBC, UA 2+ (A) Negative   Bilirubin, UA Negative Negative   Urobilinogen, Ur 0.2 0.2 - 1.0 mg/dL   Nitrite, UA Positive (A) Negative   Microscopic  Examination See below:   Microscopic Examination     Status: Abnormal   Collection Time: 12/26/17 10:18 AM  Result Value Ref Range   WBC, UA >30 (H) 0 - 5 /hpf   RBC, UA 3-10 (A) 0 - 2 /hpf   Epithelial Cells (non renal) None seen 0 - 10 /hpf   Mucus, UA Present (A) Not Estab.   Bacteria, UA Many (A) None seen/Few  Urinalysis, Complete     Status: Abnormal   Collection Time: 02/05/18  8:41 AM  Result Value Ref Range   Specific Gravity, UA 1.010 1.005 - 1.030   pH, UA 5.0 5.0 - 7.5   Color, UA Yellow Yellow   Appearance Ur Clear Clear   Leukocytes, UA 1+ (A) Negative   Protein, UA Negative Negative/Trace   Glucose, UA Negative Negative   Ketones, UA Negative Negative   RBC, UA Negative Negative   Bilirubin, UA Negative Negative   Urobilinogen, Ur 0.2 0.2 - 1.0 mg/dL   Nitrite, UA Positive (A) Negative   Microscopic Examination See below:   Microscopic Examination     Status: Abnormal   Collection Time: 02/05/18  8:41 AM  Result Value Ref Range   WBC, UA 0-5 0 - 5 /hpf   RBC, UA 0-2 0 - 2 /hpf   Epithelial Cells (non renal) None seen 0 - 10 /hpf   Bacteria, UA Few (A) None seen/Few     PHQ2/9: Depression screen [facility] 2/9 03/08/2018 10/12/2017 08/31/2016 03/01/2016 03/01/2015  Decreased Interest 0 0 0 0 0  Down, Depressed, Hopeless 0 0 0 0 0  PHQ - 2 Score 0 0 0 0 0     Fall Risk: Fall Risk  03/08/2018 10/12/2017 08/31/2016 03/01/2016 03/01/2015  Falls in the past year? No No No No No     Functional Status Survey: Is the patient deaf or have difficulty hearing?: No Does the patient have difficulty seeing, even when wearing glasses/contacts?: No Does the patient have difficulty concentrating, remembering, or making decisions?: No Does the patient have difficulty walking or climbing stairs?: No Does the patient have difficulty dressing or bathing?: No Does the patient have difficulty doing errands alone such as visiting a doctor's office or shopping?: No    Assessment &  Plan  1. Encounter for routine history and physical exam for male  Discussed importance of 150 minutes of physical activity weekly, eat two servings of fish weekly, eat one serving of tree nuts ( cashews, pistachios, pecans, almonds.Marland Kitchen) every other day, eat 6 servings of fruit/vegetables daily and drink plenty of water and avoid sweet beverages.  -EKG - normal SR  2. Chronic kidney insufficiency, stage 3 (moderate) (HCC)  - COMPLETE METABOLIC PANEL WITH GFR - CBC with Differential/Platelet - Parathyroid hormone, intact (no Ca) - EKG  3. Vitamin D deficiency  - VITAMIN D 25 Hydroxy (Vit-D Deficiency, Fractures)  4. Lipid screening  - Lipid panel  5. Hydronephrosis, left   6. Secondary hyperparathyroidism of renal origin [facility])  Seeing Dr. Cherylann Ratel   7. History of epididymitis   8. Benign prostatic hyperplasia with urinary obstruction  - PSA  9. Intermittent self-catheterization of bladder   10. Diabetes mellitus screening  -  Hemoglobin A1c

## 2018-03-08 NOTE — Patient Instructions (Signed)
Research about ACE inhibitors and ARB for kidney protection in patients with CKI   Preventive Care 40-64 Years, Male Preventive care refers to lifestyle choices and visits with your health care provider that can promote health and wellness. What does preventive care include?  A yearly physical exam. This is also called an annual well check.  Dental exams once or twice a year.  Routine eye exams. Ask your health care provider how often you should have your eyes checked.  Personal lifestyle choices, including: ? Daily care of your teeth and gums. ? Regular physical activity. ? Eating a healthy diet. ? Avoiding tobacco and drug use. ? Limiting alcohol use. ? Practicing safe sex. ? Taking low-dose aspirin every day starting at age 23. What happens during an annual well check? The services and screenings done by your health care provider during your annual well check will depend on your age, overall health, lifestyle risk factors, and family history of disease. Counseling Your health care provider may ask you questions about your:  Alcohol use.  Tobacco use.  Drug use.  Emotional well-being.  Home and relationship well-being.  Sexual activity.  Eating habits.  Work and work Statistician.  Screening You may have the following tests or measurements:  Height, weight, and BMI.  Blood pressure.  Lipid and cholesterol levels. These may be checked every 5 years, or more frequently if you are over 105 years old.  Skin check.  Lung cancer screening. You may have this screening every year starting at age 79 if you have a 30-pack-year history of smoking and currently smoke or have quit within the past 15 years.  Fecal occult blood test (FOBT) of the stool. You may have this test every year starting at age 34.  Flexible sigmoidoscopy or colonoscopy. You may have a sigmoidoscopy every 5 years or a colonoscopy every 10 years starting at age 48.  Prostate cancer screening.  Recommendations will vary depending on your family history and other risks.  Hepatitis C blood test.  Hepatitis B blood test.  Sexually transmitted disease (STD) testing.  Diabetes screening. This is done by checking your blood sugar (glucose) after you have not eaten for a while (fasting). You may have this done every 1-3 years.  Discuss your test results, treatment options, and if necessary, the need for more tests with your health care provider. Vaccines Your health care provider may recommend certain vaccines, such as:  Influenza vaccine. This is recommended every year.  Tetanus, diphtheria, and acellular pertussis (Tdap, Td) vaccine. You may need a Td booster every 10 years.  Varicella vaccine. You may need this if you have not been vaccinated.  Zoster vaccine. You may need this after age 48.  Measles, mumps, and rubella (MMR) vaccine. You may need at least one dose of MMR if you were born in 1957 or later. You may also need a second dose.  Pneumococcal 13-valent conjugate (PCV13) vaccine. You may need this if you have certain conditions and have not been vaccinated.  Pneumococcal polysaccharide (PPSV23) vaccine. You may need one or two doses if you smoke cigarettes or if you have certain conditions.  Meningococcal vaccine. You may need this if you have certain conditions.  Hepatitis A vaccine. You may need this if you have certain conditions or if you travel or work in places where you may be exposed to hepatitis A.  Hepatitis B vaccine. You may need this if you have certain conditions or if you travel or work in places  where you may be exposed to hepatitis B.  Haemophilus influenzae type b (Hib) vaccine. You may need this if you have certain risk factors.  Talk to your health care provider about which screenings and vaccines you need and how often you need them. This information is not intended to replace advice given to you by your health care provider. Make sure you  discuss any questions you have with your health care provider. Document Released: 09/24/2015 Document Revised: 05/17/2016 Document Reviewed: 06/29/2015 Elsevier Interactive Patient Education  [employer].

## 2018-03-11 LAB — CBC WITH DIFFERENTIAL/PLATELET
BASOS PCT: 1.5 %
Basophils Absolute: 60 cells/uL (ref 0–200)
EOS PCT: 5.3 %
Eosinophils Absolute: 212 cells/uL (ref 15–500)
HCT: 45.5 % (ref 38.5–50.0)
Hemoglobin: 14.8 g/dL (ref 13.2–17.1)
Lymphs Abs: 1144 cells/uL (ref 850–3900)
MCH: 26.9 pg — ABNORMAL LOW (ref 27.0–33.0)
MCHC: 32.5 g/dL (ref 32.0–36.0)
MCV: 82.6 fL (ref 80.0–100.0)
MPV: 10.7 fL (ref 7.5–12.5)
Monocytes Relative: 7 %
Neutro Abs: 2304 cells/uL (ref 1500–7800)
Neutrophils Relative %: 57.6 %
PLATELETS: 245 10*3/uL (ref 140–400)
RBC: 5.51 10*6/uL (ref 4.20–5.80)
RDW: 13.8 % (ref 11.0–15.0)
TOTAL LYMPHOCYTE: 28.6 %
WBC: 4 10*3/uL (ref 3.8–10.8)
WBCMIX: 280 {cells}/uL (ref 200–950)

## 2018-03-11 LAB — COMPLETE METABOLIC PANEL WITH GFR
AG Ratio: 1.6 (calc) (ref 1.0–2.5)
ALKALINE PHOSPHATASE (APISO): 73 U/L (ref 40–115)
ALT: 17 U/L (ref 9–46)
AST: 21 U/L (ref 10–35)
Albumin: 4.5 g/dL (ref 3.6–5.1)
BILIRUBIN TOTAL: 0.6 mg/dL (ref 0.2–1.2)
BUN/Creatinine Ratio: 13 (calc) (ref 6–22)
BUN: 32 mg/dL — AB (ref 7–25)
CHLORIDE: 102 mmol/L (ref 98–110)
CO2: 25 mmol/L (ref 20–32)
CREATININE: 2.43 mg/dL — AB (ref 0.70–1.33)
Calcium: 9.8 mg/dL (ref 8.6–10.3)
GFR, Est African American: 34 mL/min/{1.73_m2} — ABNORMAL LOW (ref >=60)
GFR, Est Non African American: 29 mL/min/{1.73_m2} — ABNORMAL LOW (ref >=60)
GLUCOSE: 87 mg/dL (ref 65–99)
Globulin: 2.9 g/dL (calc) (ref 1.9–3.7)
Potassium: 4.3 mmol/L (ref 3.5–5.3)
SODIUM: 137 mmol/L (ref 135–146)
Total Protein: 7.4 g/dL (ref 6.1–8.1)

## 2018-03-11 LAB — LIPID PANEL
CHOL/HDL RATIO: 4.7 (calc) (ref <5.0)
Cholesterol: 227 mg/dL — ABNORMAL HIGH (ref <200)
HDL: 48 mg/dL (ref >40)
LDL CHOLESTEROL (CALC): 159 mg/dL — AB
Non-HDL Cholesterol (Calc): 179 mg/dL (calc) — ABNORMAL HIGH (ref <130)
Triglycerides: 94 mg/dL (ref <150)

## 2018-03-11 LAB — HEMOGLOBIN A1C
EAG (MMOL/L): 5.8 (calc)
Hgb A1c MFr Bld: 5.3 % of total Hgb (ref <5.7)
Mean Plasma Glucose: 105 (calc)

## 2018-03-11 LAB — VITAMIN D 25 HYDROXY (VIT D DEFICIENCY, FRACTURES): VIT D 25 HYDROXY: 83 ng/mL (ref 30–100)

## 2018-03-11 LAB — PSA: PSA: 0.9 ng/mL (ref <=4.0)

## 2018-03-11 LAB — PARATHYROID HORMONE, INTACT (NO CA): PTH: 49 pg/mL (ref 14–64)

## 2018-03-28 ENCOUNTER — Other Ambulatory Visit: Payer: Self-pay

## 2018-03-28 MED ORDER — BARD COUDE TIP CATHETER MISC: 12 refills | Status: DC

## 2018-03-28 NOTE — Telephone Encounter (Signed)
[facility].  Order printed and faxed to [phone].

## 2018-05-09 LAB — CBC AND DIFFERENTIAL: Hemoglobin: 14.8 (ref 13.5–17.5)

## 2018-05-09 LAB — VITAMIN D 25 HYDROXY (VIT D DEFICIENCY, FRACTURES): Vit D, 25-Hydroxy: 91

## 2018-05-14 ENCOUNTER — Other Ambulatory Visit: Payer: Self-pay

## 2018-05-14 ENCOUNTER — Ambulatory Visit (INDEPENDENT_AMBULATORY_CARE_PROVIDER_SITE_OTHER): Payer: Managed Care, Other (non HMO) | Admitting: Urology

## 2018-05-14 ENCOUNTER — Encounter: Payer: Self-pay | Admitting: Urology

## 2018-05-14 VITALS — BP 121/68 | HR 68 | Ht 71.0 in | Wt 191.7 lb

## 2018-05-14 DIAGNOSIS — R339 Retention of urine, unspecified: Secondary | ICD-10-CM | POA: Diagnosis not present

## 2018-05-14 DIAGNOSIS — N183 Chronic kidney disease, stage 3 unspecified: Secondary | ICD-10-CM

## 2018-05-14 DIAGNOSIS — N1330 Unspecified hydronephrosis: Secondary | ICD-10-CM

## 2018-05-14 DIAGNOSIS — N312 Flaccid neuropathic bladder, not elsewhere classified: Secondary | ICD-10-CM | POA: Diagnosis not present

## 2018-05-14 LAB — BLADDER SCAN AMB NON-IMAGING: Scan Result: 0

## 2018-05-14 NOTE — Progress Notes (Signed)
05/14/2018 9:32 AM   Mitchell Doyle 1966/09/03 [id number]  Referring provider: Alba Cory, MD [address] [place], [place] [address]  Chief Complaint  Patient presents with  . Hydronephrosis    HPI: 52 year old African American male with a history of urinary retention and prior TUR bladder neck at [facility] and CKD presents for follow up.    He underwent TUR of the bladder neck on 12/21/2014 at [facility] after he experienced failed voiding trial while on alpha blockers and dutasteride.   He failed a fill and pull at [facility] on 12/31/2014 and was instructed on CIC.  He was seen at [facility] in 09/2017 and a testicular ultrasound confirmed epididymitis and he was given a course of Levaquin and his symptoms resolved.  His urine culture was positive for E. Coli.   He was referred on to nephrology for his CKD.  A renal ultrasound was obtained on November 09, 2017 and it noted moderate left-sided hydronephrosis, increased compared to the previous study of 08/26/2014. Consider CT for further characterization.  Normal right kidney.  Large postvoid residual in the bladder, possibly contributing to the left-sided hydronephrosis.  He had stopped self cathing approximately 2 months prior to seeing nephrology.  On 11/12/2017, his PVR was > 864 mL.  Current creatinine is 2.3.    On 11/19/2017, his Foley is removed.   His repeated creatinine was 2.46.  His repeated RUS performed on 11/28/2017 noted a decrease in his left-sided hydronephrosis. Bilateral ureteral jets are observed though the left jet is weaker than the right. Mildly increased renal cortical echoes texture bilaterally still slightly lower than that of the adjacent liver. This is compatible with medical renal disease.  Thick walled partially distended urinary bladder.    CT w/wo in 12/2017 revealed mild left hydroureteronephrosis to the level of the left UVJ, with no evidence of an obstructing stone or mass, noting the above limitation.   Punctate clustered nonobstructing lower left renal stones.  Bilateral renal cortical scarring, moderate on the left and mild on the right. Asymmetric moderate left renal atrophy. No renal masses.  Nonspecific moderate diffuse bladder wall thickening, presumably due to chronic bladder outlet obstruction by the enlarged prostate.  Cystoscopy in 12/2017 revealed no urethral strictures/lesions are present.  Mild lateral lobe prostate enlargement.  Open bladder neck.  Bilateral ureteral orifices not identified.  Bladder mucosa  reveals no solid or papillary tumors.  Thickened bladder wall with erythema at base secondary to CIC.  No bladder stones.  Moderate trabeculation.  Retroflexion shows no intravesical median lobe  The UDS was performed on 01/29/2018.  Bladder capacity was 1100 mL.  There was loss of compliance.  Maximum detrusor pressure was 73 cm of water and he was only able to void 13 mL.  Serum creatinine was 2.43 on February 28, 2018.    His PVR today is 0 mL.    He is CIC x 3 daily during the week.  CIC x 4 on the weekends.  He can void spontaneously in the am.  He was recently seen by nephrology and had labs and UA.  Patient states that his labs were normal online.  Patient denies any gross hematuria, dysuria or suprapubic/flank pain.  Patient denies any fevers, chills, nausea or vomiting.   Was given Myrbetriq 50 mg and did not find it helpful.    PMH: Past Medical History:  Diagnosis Date  . Bladder distension   . History of colonoscopy 2014  . Hyperlipidemia   .  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 Other urinary incontinence   . Overweight   . Polyp of colon   . Premature ejaculation   . Vitamin D insufficiency     Surgical History: Past Surgical History:  Procedure Laterality Date  . COLONOSCOPY WITH PROPOFOL N/A 02/02/2017   Procedure: COLONOSCOPY WITH PROPOFOL;  Surgeon: Midge Minium, MD;  Location: [facility];  Service: Endoscopy;  Laterality: N/A;   . TONSILLECTOMY    . transurethral incision of bladder neck Bilateral 12/20/2008    Home Medications:  Allergies as of 05/14/2018   No Known Allergies     Medication List        Accurate as of 05/14/18  9:32 AM. Always use your most recent med list.          BARD COUDE TIP CATHETER Misc Use 4 times daily as needed   finasteride 5 MG tablet Commonly known as:  PROSCAR TAKE 1 TABLET BY MOUTH (5MG  TOTAL) DAILY   Fish Oil 1000 MG Caps Take 1 capsule (1,000 mg total) by mouth 2 (two) times daily.   MAGNESIUM OXIDE PO Take by mouth.   tamsulosin 0.4 MG Caps capsule Commonly known as:  FLOMAX TAKE 1 CAPSULE BY MOUTH DAILY   VITAMIN B-12 PO Take by mouth.   Vitamin D 2000 units tablet Take by mouth.       Allergies: No Known Allergies  Family History: Family History  Problem Relation Age of Onset  . Hypertension Mother   . Hypertension Father   . Prostate cancer Neg Hx   . Kidney cancer Neg Hx   . Bladder Cancer Neg Hx     Social History:  reports that he has never smoked. He has never used smokeless tobacco. He reports that he does not drink alcohol or use drugs.  ROS: UROLOGY Frequent Urination?: No Hard to postpone urination?: No Burning/pain with urination?: No Get up at night to urinate?: No Leakage of urine?: No Urine stream starts and stops?: No Trouble starting stream?: No Do you have to strain to urinate?: No Blood in urine?: No Urinary tract infection?: No Sexually transmitted disease?: No Injury to kidneys or bladder?: No Painful intercourse?: No Weak stream?: No Erection problems?: No Penile pain?: No  Gastrointestinal Nausea?: No Vomiting?: No Indigestion/heartburn?: No Diarrhea?: No Constipation?: No  Constitutional Fever: No Night sweats?: No Weight loss?: No Fatigue?: No  Skin Skin rash/lesions?: No Itching?: No  Eyes Blurred vision?: No Double vision?: No  Ears/Nose/Throat Sore throat?: No Sinus problems?:  No  Hematologic/Lymphatic Swollen glands?: No Easy bruising?: No  Cardiovascular Leg swelling?: No Chest pain?: No  Respiratory Cough?: No Shortness of breath?: No  Endocrine Excessive thirst?: No  Musculoskeletal Back pain?: No Joint pain?: No  Neurological Headaches?: No Dizziness?: No  Psychologic Depression?: No Anxiety?: No  Physical Exam: BP 121/68   Pulse 68   Ht 5\' 11"  (1.803 m)   Wt 191 lb 11.2 oz (87 kg)   BMI 26.74 kg/m   Constitutional: Well nourished. Alert and oriented, No acute distress. HEENT: [place] AT, moist mucus membranes. Trachea midline, no masses. Cardiovascular: No clubbing, cyanosis, or edema. Respiratory: Normal respiratory effort, no increased work of breathing. Skin: No rashes, bruises or suspicious lesions. Lymph: No cervical or inguinal adenopathy. Neurologic: Grossly intact, no focal deficits, moving all 4 extremities. Psychiatric: Normal mood and affect.  Laboratory data See HPI  Pertinent imaging Results for orders placed or performed in visit on 05/14/18  BLADDER SCAN AMB NON-IMAGING  Result Value Ref Range   Scan Result 0      Assessment & Plan:    1. Left Hydronephrosis Mild, likely due to reflux Creatinine 2.43 in 02/2018  Repeat RUS in one year or if worsening of creatinine 12/2018  2. Urinary retention Patient will CIC five times daily indefinitely due to urinary retention  Cystoscopy negative for BOO - repeat in on year  12/2018 Will speak with Dr. Sherron Monday about further treatment options Will also research of PTNS is a viable treatment option for patient  3. Irregularly thickened bladder Cystoscopy was negative  3. CKD Followed by Dr. Nelwyn Salisbury, PA-C  [facility] [address], [address] [place], [place] [address] [phone]

## 2018-05-20 ENCOUNTER — Telehealth: Payer: Self-pay | Admitting: Urology

## 2018-05-20 NOTE — Telephone Encounter (Signed)
I left word with Mr. Blackmon to call us back.  He needs an appointment with Dr. Sherron Monday.

## 2018-05-20 NOTE — Telephone Encounter (Signed)
Pt called office, refused to set up appt at this time as he states he doesn't feel the need to make a return appt with Dr. Sherron Monday right now, pt states he is able to urinate a little on his own and continues to cath himself. Did not wish to make appt with Macdiarmid.

## 2018-06-07 ENCOUNTER — Other Ambulatory Visit: Payer: Self-pay

## 2018-06-07 ENCOUNTER — Ambulatory Visit (INDEPENDENT_AMBULATORY_CARE_PROVIDER_SITE_OTHER): Payer: Managed Care, Other (non HMO)

## 2018-06-07 DIAGNOSIS — R339 Retention of urine, unspecified: Secondary | ICD-10-CM

## 2018-06-07 NOTE — Progress Notes (Signed)
PTNS  Session # 1  Health & Social Factors: No Change Caffeine: 0 Alcohol: 0 Daytime voids #per day: 4-5 Night-time voids #per night: 0 Urgency: None Incontinence Episodes #per day: 0 Ankle used: Right Treatment Setting: 2 Feeling/ Response: Both Comments: Pt self caths  Preformed By: Nydia Bouton, CMA  Follow Up: 1 week

## 2018-06-14 ENCOUNTER — Ambulatory Visit (INDEPENDENT_AMBULATORY_CARE_PROVIDER_SITE_OTHER): Payer: Managed Care, Other (non HMO) | Admitting: Family Medicine

## 2018-06-14 DIAGNOSIS — R339 Retention of urine, unspecified: Secondary | ICD-10-CM

## 2018-06-14 NOTE — Progress Notes (Addendum)
PTNS  Session # 2  Health & Social Factors: no change Caffeine: 0 Alcohol: 0 Daytime voids #per day: Cath ing 4 times daily Night-time voids #per night: 0 Urgency: none Incontinence Episodes #per day: 0 Ankle used: left Treatment Setting: 3 Feeling/ Response: sensory Comments: Patient tolerated well.  Preformed By: Teressa Lower, CMA  Follow Up: 1 week #3

## 2018-06-21 ENCOUNTER — Ambulatory Visit (INDEPENDENT_AMBULATORY_CARE_PROVIDER_SITE_OTHER): Payer: Managed Care, Other (non HMO) | Admitting: Family Medicine

## 2018-06-21 DIAGNOSIS — R339 Retention of urine, unspecified: Secondary | ICD-10-CM | POA: Diagnosis not present

## 2018-06-21 NOTE — Progress Notes (Signed)
PTNS  Session # 3  Health & Social Factors: no change Caffeine: 0 Alcohol: 0 Daytime voids #per day: Cathing 4 times daily Night-time voids #per night:  Urgency: none Incontinence Episodes #per day: 0 Ankle used: right Treatment Setting: 7 Feeling/ Response: both Comments: patient tolerated well  Preformed By: Teressa Lower, CMA  Follow Up: 1 week

## 2018-06-28 ENCOUNTER — Ambulatory Visit (INDEPENDENT_AMBULATORY_CARE_PROVIDER_SITE_OTHER): Payer: Managed Care, Other (non HMO) | Admitting: Family Medicine

## 2018-06-28 DIAGNOSIS — R339 Retention of urine, unspecified: Secondary | ICD-10-CM | POA: Diagnosis not present

## 2018-06-28 NOTE — Progress Notes (Signed)
PTNS  Session # 4  Health & Social Factors: No change Caffeine: 0 Alcohol: 0 Daytime voids #per day: Cathing 4 times daily Night-time voids #per night:  Urgency: none Incontinence Episodes #per day: 0 Ankle used: left Treatment Setting: 8 Feeling/ Response: sensory Comments: Patient tolerated well.  Preformed By: Teressa Lower, CMA   Follow Up: 1 week #5

## 2018-07-05 ENCOUNTER — Ambulatory Visit (INDEPENDENT_AMBULATORY_CARE_PROVIDER_SITE_OTHER): Payer: Managed Care, Other (non HMO) | Admitting: Family Medicine

## 2018-07-05 DIAGNOSIS — N312 Flaccid neuropathic bladder, not elsewhere classified: Secondary | ICD-10-CM

## 2018-07-05 NOTE — Progress Notes (Signed)
PTNS  Session # 5  Health & Social Factors: no change Caffeine: 0 Alcohol: 0 Daytime voids #per day:  Night-time voids #per night: Cath ing 4 times daily Urgency: none Incontinence Episodes #per day: 0 Ankle used: right Treatment Setting: 8 Feeling/ Response: Both Comments: Patient tolerated well  Preformed By: Teressa Lower, CMA  Follow Up: 1 week

## 2018-07-12 ENCOUNTER — Ambulatory Visit (INDEPENDENT_AMBULATORY_CARE_PROVIDER_SITE_OTHER): Payer: Managed Care, Other (non HMO) | Admitting: Family Medicine

## 2018-07-12 DIAGNOSIS — N312 Flaccid neuropathic bladder, not elsewhere classified: Secondary | ICD-10-CM

## 2018-07-12 NOTE — Progress Notes (Signed)
PTNS  Session # 6  Health & Social Factors: no change Caffeine: 0 Alcohol: 0 Daytime voids #per day: cath ing 4 times daily Night-time voids #per night:  Urgency: none Incontinence Episodes #per day: 0 Ankle used: left Treatment Setting: 7 Feeling/ Response: Both Comments: patient tolerated well  Preformed By: Teressa Lower, CMA  Follow Up: 1 week #7

## 2018-07-19 ENCOUNTER — Ambulatory Visit: Payer: Managed Care, Other (non HMO)

## 2018-07-26 ENCOUNTER — Ambulatory Visit: Payer: Managed Care, Other (non HMO)

## 2018-08-02 ENCOUNTER — Ambulatory Visit: Payer: Managed Care, Other (non HMO)

## 2018-08-16 ENCOUNTER — Ambulatory Visit: Payer: Managed Care, Other (non HMO)

## 2018-08-23 ENCOUNTER — Ambulatory Visit: Payer: Managed Care, Other (non HMO)

## 2018-08-26 ENCOUNTER — Encounter: Payer: Self-pay | Admitting: Family Medicine

## 2018-08-26 ENCOUNTER — Ambulatory Visit (INDEPENDENT_AMBULATORY_CARE_PROVIDER_SITE_OTHER): Payer: Managed Care, Other (non HMO) | Admitting: Family Medicine

## 2018-08-26 VITALS — BP 130/76 | HR 81 | Temp 98.3°F | Resp 16 | Ht 71.0 in | Wt 192.5 lb

## 2018-08-26 DIAGNOSIS — N183 Chronic kidney disease, stage 3 unspecified: Secondary | ICD-10-CM

## 2018-08-26 DIAGNOSIS — N261 Atrophy of kidney (terminal): Secondary | ICD-10-CM | POA: Diagnosis not present

## 2018-08-26 DIAGNOSIS — N2889 Other specified disorders of kidney and ureter: Secondary | ICD-10-CM

## 2018-08-26 DIAGNOSIS — Z23 Encounter for immunization: Secondary | ICD-10-CM

## 2018-08-26 DIAGNOSIS — I700 Atherosclerosis of aorta: Secondary | ICD-10-CM

## 2018-08-26 DIAGNOSIS — Z789 Other specified health status: Secondary | ICD-10-CM

## 2018-08-26 DIAGNOSIS — E7800 Pure hypercholesterolemia, unspecified: Secondary | ICD-10-CM

## 2018-08-26 NOTE — Progress Notes (Signed)
Name: Mitchell Doyle   MRN: [id number]    DOB: November 19, 1965   Date:08/26/2018       Progress Note  Subjective  Chief Complaint  Chief Complaint  Patient presents with  . Chronic Kidney Disease  . Vitamin D Defiency  . Dyslipidemia    HPI  CKI: he had a GFR of 33 June 2018 with elevation of potassium level, during [facility] visit at [facility] his GFR was 36 %, he came in today to discuss results. CKI secondary to left kidney hydronephrosis because of urinary retention, still sees nephrologist and Urologist. GFR has been stable. We will continue to monitor. He still need to self cath 4 times daily. He denies hematuria  Atherosclerosis of aorta: discussed CT scan done, he has hyperlipidemia, discussed aspirin and also statin therapy but he does not want to start it at this time, he states for the past 4 months he is on plant diet and would like to repeat labs today.  BPH: history of TURP in 2016 at [facility] , he has been doing self calth 3-4 times daily and states he does not mind doing.   Patient Active Problem List   Diagnosis Date Noted  . Hydronephrosis, left 03/08/2018  . Secondary hyperparathyroidism of renal origin (HCC) 03/08/2018  . Intermittent self-catheterization of bladder 03/08/2018  . History of epididymitis 03/08/2018  . Chronic kidney disease (CKD), stage III (moderate) (HCC) 03/06/2017  . Internal hemorrhoids without complication 03/05/2017  . Personal history of colonic polyps   . Benign prostatic hyperplasia with urinary obstruction 02/27/2015  . History of urinary retention 02/27/2015  . Vitamin D deficiency 08/10/2009  . Dyslipidemia 02/11/2007    Past Surgical History:  Procedure Laterality Date  . COLONOSCOPY WITH PROPOFOL N/A 02/02/2017   Procedure: COLONOSCOPY WITH PROPOFOL;  Surgeon: Midge Minium, MD;  Location: [facility];  Service: Endoscopy;  Laterality: N/A;  . TONSILLECTOMY    . transurethral incision of bladder neck Bilateral 12/20/2008    Family  History  Problem Relation Age of Onset  . Hypertension Mother   . Hypertension Father   . Prostate cancer Neg Hx   . Kidney cancer Neg Hx   . Bladder Cancer Neg Hx     Social History   Socioeconomic History  . Marital status: Married    Spouse name: Vance Gather  . Number of children: 3  . Years of education: Not on file  . Highest education level: Bachelor's degree (e.g., BA, AB, BS)  Occupational History  . Not on file  Social Needs  . Financial resource strain: Not hard at all  . Food insecurity:    Worry: Never true    Inability: Never true  . Transportation needs:    Medical: No    Non-medical: No  Tobacco Use  . Smoking status: Never Smoker  . Smokeless tobacco: Never Used  Substance and Sexual Activity  . Alcohol use: No    Alcohol/week: 0.0 standard drinks  . Drug use: No  . Sexual activity: Yes    Partners: Female    Birth control/protection: None  Lifestyle  . Physical activity:    Days per week: 4 days    Minutes per session: 50 min  . Stress: Not at all  Relationships  . Social connections:    Talks on phone: More than three times a week    Gets together: More than three times a week    Attends religious service: More than 4 times per year  Active member of club or organization: No    Attends meetings of clubs or organizations: Never    Relationship status: Married  . Intimate partner violence:    Fear of current or ex partner: Not on file    Emotionally abused: Not on file    Physically abused: Not on file    Forced sexual activity: Not on file  Other Topics Concern  . Not on file  Social History Narrative   Patient stated that he worries about his boys (70, 85 & 4)     Current Outpatient Medications:  .  Catheters (BARD COUDE TIP CATHETER) MISC, Use 4 times daily as needed, Disp: 300 each, Rfl: 12 .  Cholecalciferol (VITAMIN D) 2000 UNITS tablet, Take by mouth., Disp: , Rfl:  .  Cyanocobalamin (VITAMIN B-12 PO), Take by mouth., Disp: , Rfl:   .  finasteride (PROSCAR) 5 MG tablet, TAKE 1 TABLET BY MOUTH (5MG  TOTAL) DAILY, Disp: 90 tablet, Rfl: 3 .  MAGNESIUM OXIDE PO, Take by mouth., Disp: , Rfl:  .  Omega-3 Fatty Acids (FISH OIL) 1000 MG CAPS, Take 1 capsule (1,000 mg total) by mouth 2 (two) times daily., Disp: 60 capsule, Rfl: 0 .  tamsulosin (FLOMAX) 0.4 MG CAPS capsule, TAKE 1 CAPSULE BY MOUTH DAILY, Disp: 90 capsule, Rfl: 3  No Known Allergies  I personally reviewed active problem list, medication list, allergies, family history, social history with the patient/caregiver today.   ROS  Constitutional: Negative for fever or weight change.  Respiratory: Negative for cough and shortness of breath.   Cardiovascular: Negative for chest pain or palpitations.  Gastrointestinal: Negative for abdominal pain, no bowel changes.  Musculoskeletal: Negative for gait problem or joint swelling.  Skin: Negative for rash.  Neurological: Negative for dizziness or headache.  No other specific complaints in a complete review of systems (except as listed in HPI above).   Objective  Vitals:   08/26/18 0808  BP: 130/76  Pulse: 81  Resp: 16  Temp: 98.3 F (36.8 C)  TempSrc: Oral  SpO2: 100%  Weight: 192 lb 8 oz (87.3 kg)  Height: 5\' 11"  (1.803 m)    Body mass index is 26.85 kg/m.  Physical Exam  Constitutional: Patient appears well-developed and well-nourished. Overweight.  No distress.  HEENT: head atraumatic, normocephalic, pupils equal and reactive to light,neck supple, throat within normal limits Cardiovascular: Normal rate, regular rhythm and normal heart sounds.  No murmur heard. No BLE edema. Pulmonary/Chest: Effort normal and breath sounds normal. No respiratory distress. Abdominal: Soft.  There is no tenderness. Psychiatric: Patient has a normal mood and affect. behavior is normal. Judgment and thought content normal.  PHQ2/9: Depression screen [facility] 2/9 03/08/2018 10/12/2017 08/31/2016 03/01/2016 03/01/2015  Decreased  Interest 0 0 0 0 0  Down, Depressed, Hopeless 0 0 0 0 0  PHQ - 2 Score 0 0 0 0 0     Fall Risk: Fall Risk  08/26/2018 03/08/2018 10/12/2017 08/31/2016 03/01/2016  Falls in the past year? 0 No No No No     Assessment & Plan  1. Left renal atrophy  Monitored by Urologist , he would like to see a minority provider and we will research it for him   2. Flu vaccine need  Refused  3. Atherosclerosis of aorta (HCC)  - Lipid panel - COMPLETE METABOLIC PANEL WITH GFR  4. Chronic kidney insufficiency, stage 3 (moderate) (HCC)  - COMPLETE METABOLIC PANEL WITH GFR - urine micro   5. Hypercholesterolemia  Recheck labs   6. Vegan diet  - CBC with Differential/Platelet - B12 and Folate Panel

## 2018-08-27 LAB — COMPLETE METABOLIC PANEL WITH GFR
AG RATIO: 1.7 (calc) (ref 1.0–2.5)
ALT: 14 U/L (ref 9–46)
AST: 17 U/L (ref 10–35)
Albumin: 4.5 g/dL (ref 3.6–5.1)
Alkaline phosphatase (APISO): 76 U/L (ref 40–115)
BUN/Creatinine Ratio: 6 (calc) (ref 6–22)
BUN: 13 mg/dL (ref 7–25)
CALCIUM: 9.9 mg/dL (ref 8.6–10.3)
CO2: 26 mmol/L (ref 20–32)
Chloride: 102 mmol/L (ref 98–110)
Creat: 2.12 mg/dL — ABNORMAL HIGH (ref 0.70–1.33)
GFR, EST NON AFRICAN AMERICAN: 34 mL/min/{1.73_m2} — AB (ref >=60)
GFR, Est African American: 40 mL/min/{1.73_m2} — ABNORMAL LOW (ref >=60)
GLUCOSE: 88 mg/dL (ref 65–99)
Globulin: 2.6 g/dL (calc) (ref 1.9–3.7)
POTASSIUM: 4.4 mmol/L (ref 3.5–5.3)
Sodium: 138 mmol/L (ref 135–146)
TOTAL PROTEIN: 7.1 g/dL (ref 6.1–8.1)
Total Bilirubin: 0.8 mg/dL (ref 0.2–1.2)

## 2018-08-27 LAB — LIPID PANEL
Cholesterol: 181 mg/dL (ref <200)
HDL: 61 mg/dL (ref >40)
LDL Cholesterol (Calc): 102 mg/dL (calc) — ABNORMAL HIGH
NON-HDL CHOLESTEROL (CALC): 120 mg/dL (ref <130)
Total CHOL/HDL Ratio: 3 (calc) (ref <5.0)
Triglycerides: 85 mg/dL (ref <150)

## 2018-08-27 LAB — B12 AND FOLATE PANEL: FOLATE: 7.2 ng/mL

## 2018-08-27 LAB — CBC WITH DIFFERENTIAL/PLATELET
ABSOLUTE MONOCYTES: 308 {cells}/uL (ref 200–950)
BASOS PCT: 1.5 %
Basophils Absolute: 60 cells/uL (ref 0–200)
EOS PCT: 4.5 %
Eosinophils Absolute: 180 cells/uL (ref 15–500)
HCT: 44.8 % (ref 38.5–50.0)
Hemoglobin: 15 g/dL (ref 13.2–17.1)
Lymphs Abs: 1624 cells/uL (ref 850–3900)
MCH: 27.9 pg (ref 27.0–33.0)
MCHC: 33.5 g/dL (ref 32.0–36.0)
MCV: 83.3 fL (ref 80.0–100.0)
MONOS PCT: 7.7 %
MPV: 11.3 fL (ref 7.5–12.5)
NEUTROS PCT: 45.7 %
Neutro Abs: 1828 cells/uL (ref 1500–7800)
PLATELETS: 212 10*3/uL (ref 140–400)
RBC: 5.38 10*6/uL (ref 4.20–5.80)
RDW: 13.9 % (ref 11.0–15.0)
TOTAL LYMPHOCYTE: 40.6 %
WBC: 4 10*3/uL (ref 3.8–10.8)

## 2018-08-27 LAB — MICROALBUMIN / CREATININE URINE RATIO
CREATININE, URINE: 53 mg/dL (ref 20–320)
MICROALB UR: 1.6 mg/dL
Microalb Creat Ratio: 30 mcg/mg creat — ABNORMAL HIGH (ref <30)

## 2018-08-30 ENCOUNTER — Ambulatory Visit: Payer: Managed Care, Other (non HMO)

## 2018-09-09 LAB — MICROALBUMIN, URINE: Microalb, Ur: 38

## 2018-09-09 LAB — BASIC METABOLIC PANEL
BUN: 13 (ref 4–21)
Creatinine: 2.2 — AB (ref 0.6–1.3)

## 2018-09-13 ENCOUNTER — Ambulatory Visit: Payer: Managed Care, Other (non HMO)

## 2018-11-11 ENCOUNTER — Other Ambulatory Visit: Payer: Self-pay | Admitting: Family Medicine

## 2018-11-11 DIAGNOSIS — N401 Enlarged prostate with lower urinary tract symptoms: Principal | ICD-10-CM

## 2018-11-11 DIAGNOSIS — N138 Other obstructive and reflux uropathy: Secondary | ICD-10-CM

## 2018-12-04 ENCOUNTER — Encounter: Payer: Self-pay | Admitting: Family Medicine

## 2019-01-19 ENCOUNTER — Other Ambulatory Visit: Payer: Self-pay | Admitting: Family Medicine

## 2019-01-19 DIAGNOSIS — N138 Other obstructive and reflux uropathy: Secondary | ICD-10-CM

## 2019-03-07 ENCOUNTER — Ambulatory Visit (INDEPENDENT_AMBULATORY_CARE_PROVIDER_SITE_OTHER): Payer: Managed Care, Other (non HMO) | Admitting: Family Medicine

## 2019-03-07 ENCOUNTER — Other Ambulatory Visit: Payer: Self-pay

## 2019-03-07 ENCOUNTER — Encounter: Payer: Self-pay | Admitting: Family Medicine

## 2019-03-07 DIAGNOSIS — I700 Atherosclerosis of aorta: Secondary | ICD-10-CM | POA: Diagnosis not present

## 2019-03-07 DIAGNOSIS — E559 Vitamin D deficiency, unspecified: Secondary | ICD-10-CM

## 2019-03-07 DIAGNOSIS — N401 Enlarged prostate with lower urinary tract symptoms: Secondary | ICD-10-CM

## 2019-03-07 DIAGNOSIS — N183 Chronic kidney disease, stage 3 unspecified: Secondary | ICD-10-CM

## 2019-03-07 DIAGNOSIS — E785 Hyperlipidemia, unspecified: Secondary | ICD-10-CM

## 2019-03-07 DIAGNOSIS — N138 Other obstructive and reflux uropathy: Secondary | ICD-10-CM

## 2019-03-07 DIAGNOSIS — N2581 Secondary hyperparathyroidism of renal origin: Secondary | ICD-10-CM

## 2019-03-07 DIAGNOSIS — N261 Atrophy of kidney (terminal): Secondary | ICD-10-CM

## 2019-03-07 DIAGNOSIS — Z789 Other specified health status: Secondary | ICD-10-CM

## 2019-03-07 MED ORDER — FINASTERIDE 5 MG PO TABS: 5.0000 mg | ORAL_TABLET | Freq: Every day | ORAL | 1 refills | Status: DC

## 2019-03-07 NOTE — Progress Notes (Signed)
Name: Mitchell Doyle   MRN: [id number]    DOB: 01-21-1966   Date:03/07/2019       Progress Note  Subjective  Chief Complaint  Chief Complaint  Patient presents with  . atherosclerosis  . Hyperlipidemia  . Chronic Kidney Disease    I connected with  Rhone L Suliman  on 03/07/19 at  7:40 AM EDT by a telephone enabled telemedicine application and verified that I am speaking with the correct person using two identifiers.  I discussed the limitations of evaluation and management by telemedicine and the availability of in person appointments. The patient expressed understanding and agreed to proceed. Staff also discussed with the patient that there may be a patient responsible charge related to this service. Patient Location: at home  Provider Location: [facility]   HPI  [facility]: he had a GFR of 33 June 2018 with elevation of potassium level. His CKI was secondary to left kidney hydronephrosis because of urinary retention, still sees nephrologist and Urologist. GFR has been stable, last GFR Dec 2020 was 4. He still need to self cath about 3  times daily, he has noticed able to void on his own usually in the am. He denies hematuria  Atherosclerosis of aorta: discussed CT scan done, he has hyperlipidemia, discussed aspirin and also statin therapy with patient, he has been eating healthy. Last LDL improved with plant base died The 10-year ASCVD risk score Mikey Bussing DC Jr., et al., 2013) is: 5.7%   Values used to calculate the score:     Age: 53 years     Sex: Male     Is Non-Hispanic African American: Yes     Diabetic: No     Tobacco smoker: No     Systolic Blood Pressure: 979 mmHg     Is BP treated: No     HDL Cholesterol: 61 mg/dL     Total Cholesterol: 181 mg/dL  BPH: history of TURP in 2016 at [facility] , he has been doing self calth 3 imes daily and states he does not mind doing it.  Vitamin D deficiency: discussed otc supplementation   Patient Active Problem List   Diagnosis Date Noted  . Atherosclerosis of aorta ([place]) 08/26/2018  . Left renal atrophy 08/26/2018  . Hydronephrosis, left 03/08/2018  . Secondary hyperparathyroidism of renal origin ([place]) 03/08/2018  . Intermittent self-catheterization of bladder 03/08/2018  . History of epididymitis 03/08/2018  . Chronic kidney disease (CKD), stage III (moderate) ([place]) 03/06/2017  . Internal hemorrhoids without complication 89/21/1941  . Personal history of colonic polyps   . Benign prostatic hyperplasia with urinary obstruction 02/27/2015  . History of urinary retention 02/27/2015  . Vitamin D deficiency 08/10/2009  . Dyslipidemia 02/11/2007    Past Surgical History:  Procedure Laterality Date  . COLONOSCOPY WITH PROPOFOL N/A 02/02/2017   Procedure: COLONOSCOPY WITH PROPOFOL;  Surgeon: Lucilla Lame, MD;  Location: [facility];  Service: Endoscopy;  Laterality: N/A;  . TONSILLECTOMY    . transurethral incision of bladder neck Bilateral 12/20/2008    Family History  Problem Relation Age of Onset  . Hypertension Mother   . Hypertension Father   . Prostate cancer Neg Hx   . Kidney cancer Neg Hx   . Bladder Cancer Neg Hx     Social History   Socioeconomic History  . Marital status: Married    Spouse name: Lollie Marrow  . Number of children: 3  . Years of education: Not on file  . Highest  education level: Bachelor's degree (e.g., BA, AB, BS)  Occupational History  . Not on file  Social Needs  . Financial resource strain: Not hard at all  . Food insecurity    Worry: Never true    Inability: Never true  . Transportation needs    Medical: No    Non-medical: No  Tobacco Use  . Smoking status: Never Smoker  . Smokeless tobacco: Never Used  Substance and Sexual Activity  . Alcohol use: No    Alcohol/week: 0.0 standard drinks  . Drug use: No  . Sexual activity: Yes    Partners: Female    Birth control/protection: None  Lifestyle  . Physical activity    Days per week: 4 days     Minutes per session: 50 min  . Stress: Not at all  Relationships  . Social connections    Talks on phone: More than three times a week    Gets together: More than three times a week    Attends religious service: More than 4 times per year    Active member of club or organization: No    Attends meetings of clubs or organizations: Never    Relationship status: Married  . Intimate partner violence    Fear of current or ex partner: Not on file    Emotionally abused: Not on file    Physically abused: Not on file    Forced sexual activity: Not on file  Other Topics Concern  . Not on file  Social History Narrative   Patient stated that he worries about his boys ([id number] [phone])     Current Outpatient Medications:  .  Catheters (BARD COUDE TIP CATHETER) MISC, Use 4 times daily as needed, Disp: 300 each, Rfl: 12 .  Cholecalciferol (VITAMIN D) 2000 UNITS tablet, Take by mouth., Disp: , Rfl:  .  Cyanocobalamin (VITAMIN B-12 PO), Take by mouth., Disp: , Rfl:  .  finasteride (PROSCAR) 5 MG tablet, TAKE 1 TABLET BY MOUTH DAILY, Disp: 90 tablet, Rfl: 0 .  MAGNESIUM OXIDE PO, Take by mouth., Disp: , Rfl:  .  Omega-3 Fatty Acids (FISH OIL) 1000 MG CAPS, Take 1 capsule (1,000 mg total) by mouth 2 (two) times daily., Disp: 60 capsule, Rfl: 0 .  tamsulosin (FLOMAX) 0.4 MG CAPS capsule, TAKE 1 CAPSULE BY MOUTH DAILY, Disp: 90 capsule, Rfl: 3  No Known Allergies  I personally reviewed active problem list, medication list, allergies, family history, social history with the patient/caregiver today.   ROS  Ten systems reviewed and is negative except as mentioned in HPI   Objective  Virtual encounter, vitals not obtained.  There is no height or weight on file to calculate BMI.  Physical Exam  Awake, alert and oriented  PHQ2/9: Depression screen [facility] 2/9 03/07/2019 03/08/2018 10/12/2017 08/31/2016 03/01/2016  Decreased Interest 0 0 0 0 0  Down, Depressed, Hopeless 0 0 0 0 0  PHQ - 2 Score 0 0 0 0 0   Altered sleeping 0 - - - -  Tired, decreased energy 0 - - - -  Change in appetite 0 - - - -  Feeling bad or failure about yourself  0 - - - -  Trouble concentrating 0 - - - -  Moving slowly or fidgety/restless 0 - - - -  Suicidal thoughts 0 - - - -  PHQ-9 Score 0 - - - -   PHQ-2/9 Result is negative.    Fall Risk: Fall Risk  03/07/2019  08/26/2018 03/08/2018 10/12/2017 08/31/2016  Falls in the past year? 0 0 No No No  Number falls in past yr: 0 - - - -  Injury with Fall? 0 - - - -     Assessment & Plan  1. Benign prostatic hyperplasia with urinary obstruction  - finasteride (PROSCAR) 5 MG tablet; Take 1 tablet (5 mg total) by mouth daily.  Dispense: 90 tablet; Refill: 1  2. Atherosclerosis of aorta (H[facility])  Refuses to take medication, but on healthy diet  3. Secondary hyperparathyroidism of renal origin [facility](HCC)  He will follow up nephrologist in July   4. Chronic kidney disease (CKD), stage III (moderate) (H[facility])   5. Vitamin D deficiency   6. Dyslipidemia  Refuses medication, doing better  7. Intermittent self-catheterization of bladder   8. Left renal atrophy  I discussed the assessment and treatment plan with the patient. The patient was provided an opportunity to ask questions and all were answered. The patient agreed with the plan and demonstrated an understanding of the instructions.  The patient was advised to call back or seek an in-person evaluation if the symptoms worsen or if the condition fails to improve as anticipated.  I provided 25  minutes of non-face-to-face time during this encounter.

## 2019-03-21 ENCOUNTER — Encounter: Payer: Self-pay | Admitting: Family Medicine

## 2019-03-25 ENCOUNTER — Encounter: Payer: Self-pay | Admitting: Family Medicine

## 2019-04-15 ENCOUNTER — Other Ambulatory Visit: Payer: Self-pay

## 2019-04-15 DIAGNOSIS — Z20822 Contact with and (suspected) exposure to covid-19: Secondary | ICD-10-CM

## 2019-04-16 LAB — NOVEL CORONAVIRUS, NAA: SARS-CoV-2, NAA: NOT DETECTED

## 2019-04-21 ENCOUNTER — Telehealth (INDEPENDENT_AMBULATORY_CARE_PROVIDER_SITE_OTHER): Payer: Managed Care, Other (non HMO) | Admitting: Urology

## 2019-04-21 ENCOUNTER — Telehealth: Payer: Self-pay | Admitting: Urology

## 2019-04-21 ENCOUNTER — Other Ambulatory Visit: Payer: Self-pay

## 2019-04-21 DIAGNOSIS — R310 Gross hematuria: Secondary | ICD-10-CM

## 2019-04-21 DIAGNOSIS — N5089 Other specified disorders of the male genital organs: Secondary | ICD-10-CM

## 2019-04-21 MED ORDER — SULFAMETHOXAZOLE-TRIMETHOPRIM 800-160 MG PO TABS: 1.0000 | ORAL_TABLET | Freq: Two times a day (BID) | ORAL | 0 refills | Status: DC

## 2019-04-21 NOTE — Telephone Encounter (Signed)
Would you schedule Mitchell Doyle for an in-office appointment next week?  Double book if necessary.

## 2019-04-21 NOTE — Progress Notes (Signed)
Virtual Visit via Telephone Note  I connected with Mitchell Doyle on 04/21/19 at  3:00 PM EDT by telephone and verified that I am speaking with the correct person using two identifiers.  Location: Patient: Home Provider: Home   I discussed the limitations, risks, security and privacy concerns of performing an evaluation and management service by telephone and the availability of in person appointments. I also discussed with the patient that there may be a patient responsible charge related to this service. The patient expressed understanding and agreed to proceed.   History of Present Illness: Mitchell Doyle is a 53 year old male with a history of left hydronephrosis, urinary retention and CKD who is contacted via telephone per patient's request as he is currently under quarantine for COVID-19 for a swollen testicle.    He states he accidentally squeezed his scrotum between his exercise bike seat and his bottom.  This occurred on Wednesday.  The next day, his scrotum was swollen and he had a low grade fever of 100 F.  The scrotal swelling improved and the fever abated over the weekend.  He feels much improved, but the swelling is still present.    He also had an episode of gross hematuria after a self cathing.  He felt like he had "two holes" where he was placing the cath and when he withdrew the cath the blood occurred.    He states he is still experiencing intermittent gross hematuria at this time.  Patient denies any gross hematuria or suprapubic/flank pain.  Patient denies any fevers, chills, nausea or vomiting.    Observations/Objective: Mitchell Doyle did not sound distressed and answered questions appropriately.    Assessment and Plan: 1. Scrotal swelling I explained that his situation was a condition that needed further evaluation in the office as it was associated with gross hematuria and low grade fevers. I will start him on Septra DS as they are on quarantine for COVID.     2. Gross  hematuria Patient will need to come into the office for further evaluation.    Follow Up Instructions:  Mitchell Doyle will come in to the office next week for further evaluation.  He states he and his wife have both tested negative for the COVID,  confirmed by Epic on 04/15/2019.    I discussed the assessment and treatment plan with the patient. The patient was provided an opportunity to ask questions and all were answered. The patient agreed with the plan and demonstrated an understanding of the instructions.   The patient was advised to call back or seek an in-person evaluation if the symptoms worsen or if the condition fails to improve as anticipated.  I provided 15 minutes of non-face-to-face time during this encounter.    Lakayla Barrington, PA-C

## 2019-04-24 NOTE — Progress Notes (Signed)
04/25/2019 10:44 AM   Christoper AllegraArvis L Doyle Nov 17, 1965 [id number]  Referring provider: Alba CorySowles, Krichna, MD [address] [place] [id number]  Chief Complaint  Patient presents with  . Hematuria    HPI: Mr. Mitchell Doyle is a 53 year old male with a history of urinary retention who presents today for gross hematuria and scrotal swelling.  History of urinary retention He [facility] bladder [facility] after he experienced failed voiding trial while on alpha blockers and dutasteride. [facility] and was instructed on CIC. [facility] and a testicular ultrasound confirmed epididymitis and he was given a course of Levaquin and his symptoms resolved. He was referred on to nephrology for his CKD. A renal ultrasound was obtained on November 09, 2017 and it noted moderate left-sided hydronephrosis, increased compared to the previous study of 08/26/2014. Consider CT for further characterization. Normal right kidney. Large postvoid residual in the bladder, possibly contributing to the left-sided hydronephrosis. He had stopped self cathing approximately 2 months prior to seeing nephrology.  His repeated RUS performed on 11/28/2017 noted adecreasein hisleft-sided hydronephrosis. Bilateral ureteral jets are observed though the left jet is weaker than the right. Mildly increased renal cortical echoes texture bilaterally still slightly lower than that of the adjacent liver. This is compatible with medical renal disease. Thick walled partially distended urinary bladder. CT w/wo in 12/2017 revealed mild left hydroureteronephrosis to the level of the left UVJ, with no evidence of an obstructing stone or mass, noting the above limitation.  Punctate clustered nonobstructing lower left renal stones.  Bilateral renal cortical scarring, moderate on the left and mild on the right. Asymmetric moderate left renal atrophy. No  renal masses.  Nonspecific moderate diffuse bladder wall thickening, presumably due to chronic bladder outlet obstruction by the enlarged prostate.  Cystoscopy in 12/2017 revealed no urethral strictures/lesions are present.  Mild lateral lobeprostate enlargement.  Openbladder neck.  Bilateral ureteral orificesnotidentified.  Bladder mucosa reveals no solid or papillary tumors. Thickened bladder wall with erythema at base secondary to CIC.  No bladder stones.  Moderatetrabeculation.  Retroflexion showsno intravesical median lobe.  The UDS was performed on 01/29/2018.  Bladder capacity was 1100 mL. There was loss of compliance.  Maximum detrusor pressure was 73 cm of water and he was only able to void 13 mL.  He contacted the office for a virtual visit for symptoms of scrotal swelling and gross hematuria.  He was under quarantine and was not able to present to the office in person.  He was given a script for Septra and is following up today.  Today, he states he is feeling better.  He feels the swelling has decreased as well as the scrotal pain.  He started his Septra DS yesterday.  Patient denies any gross hematuria, dysuria or suprapubic/flank pain.  Patient denies any fevers, chills, nausea or vomiting.  His UA today is nitrite positive, 6-10 WBC's and moderate bacteria.  His PVR is 470 cc and recheck after he cathed himself PVR was 0 cc.    PMH: Past Medical History:  Diagnosis Date  . Bladder distension   . History of colonoscopy 2014  . Hyperlipidemia   . 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 Other urinary incontinence   . Overweight   . Polyp of colon   . Premature ejaculation   . Vitamin D insufficiency  Surgical History: Past Surgical History:  Procedure Laterality Date  . COLONOSCOPY WITH PROPOFOL N/A 02/02/2017   Procedure: COLONOSCOPY WITH PROPOFOL;  Surgeon: Midge MiniumWohl, Darren, MD;  [facility];  Service: Endoscopy;  Laterality: N/A;  .  TONSILLECTOMY    . transurethral incision of bladder neck Bilateral 12/20/2008    Home Medications:  Allergies as of 04/25/2019   No Known Allergies     Medication List       Accurate as of April 25, 2019 10:44 AM. If you have any questions, ask your nurse or doctor.        Bard Coude Tip Catheter Misc Use 4 times daily as needed   finasteride 5 MG tablet Commonly known as: PROSCAR Take 1 tablet (5 mg total) by mouth daily.   Fish Oil 1000 MG Caps Take 1 capsule (1,000 mg total) by mouth 2 (two) times daily.   MAGNESIUM OXIDE PO Take by mouth.   sulfamethoxazole-trimethoprim 800-160 MG tablet Commonly known as: BACTRIM DS Take 1 tablet by mouth every 12 (twelve) hours.   tamsulosin 0.4 MG Caps capsule Commonly known as: FLOMAX TAKE 1 CAPSULE BY MOUTH DAILY   VITAMIN B-12 PO Take by mouth.   Vitamin D 50 MCG (2000 UT) tablet Take by mouth.       Allergies: No Known Allergies  Family History: Family History  Problem Relation Age of Onset  . Hypertension Mother   . Hypertension Father   . Prostate cancer Neg Hx   . Kidney cancer Neg Hx   . Bladder Cancer Neg Hx     Social History:  reports that he has never smoked. He has never used smokeless tobacco. He reports that he does not drink alcohol or use drugs.  ROS: UROLOGY Frequent Urination?: No Hard to postpone urination?: No Burning/pain with urination?: No Get up at night to urinate?: No Leakage of urine?: No Urine stream starts and stops?: No Trouble starting stream?: No Do you have to strain to urinate?: No Blood in urine?: No Urinary tract infection?: Yes Sexually transmitted disease?: No Injury to kidneys or bladder?: No Painful intercourse?: No Weak stream?: No Erection problems?: No Penile pain?: No  Gastrointestinal Nausea?: No Vomiting?: No Indigestion/heartburn?: No Diarrhea?: No Constipation?: No  Constitutional Fever: No Night sweats?: No Weight loss?: No Fatigue?: No   Skin Skin rash/lesions?: No Itching?: No  Eyes Blurred vision?: No Double vision?: No  Ears/Nose/Throat Sore throat?: No Sinus problems?: No  Hematologic/Lymphatic Swollen glands?: No Easy bruising?: No  Cardiovascular Leg swelling?: No Chest pain?: No  Respiratory Cough?: No Shortness of breath?: No  Endocrine Excessive thirst?: No  Musculoskeletal Back pain?: No Joint pain?: No  Neurological Headaches?: No Dizziness?: No  Psychologic Depression?: No Anxiety?: No  Physical Exam: BP 138/80 (BP Location: Left Arm, Patient Position: Sitting, Cuff Size: Normal)   Pulse 79   Ht 5\' 11"  (1.803 m)   Wt 187 lb (84.8 kg)   BMI 26.08 kg/m   Constitutional:  Well nourished. Alert and oriented, No acute distress. HEENT:  AT, moist mucus membranes.  Trachea midline, no masses. Cardiovascular: No clubbing, cyanosis, or edema. Respiratory: Normal respiratory effort, no increased work of breathing. GI: Abdomen is soft, non tender, non distended, no abdominal masses. Liver and spleen not palpable.  No hernias appreciated.  Stool sample for occult testing is not indicated.   GU: No CVA tenderness.  No bladder fullness or masses.  Patient with uncircumcised phallus.  Foreskin easily retracted.  Urethral meatus  is patent.  No penile discharge. No penile lesions or rashes. Scrotum without lesions, cysts, rashes and/or edema.  Testicles are located scrotally bilaterally. No masses are appreciated in the testicles. Left and right epididymis are normal. Rectal: Patient with  normal sphincter tone. Anus and perineum without scarring or rashes. No rectal masses are appreciated. Prostate is approximately 60 grams, could only palpate the apex and midportion of the gland, no nodules are appreciated. Seminal vesicles could not be palpated Skin: No rashes, bruises or suspicious lesions. Lymph: No inguinal adenopathy. Neurologic: Grossly intact, no focal deficits, moving all 4  extremities. Psychiatric: Normal mood and affect.  Laboratory Data: Lab Results  Component Value Date   WBC 4.0 08/26/2018   HGB 15.0 08/26/2018   HCT 44.8 08/26/2018   MCV 83.3 08/26/2018   PLT 212 08/26/2018    Lab Results  Component Value Date   CREATININE 2.2 (A) 09/09/2018    Lab Results  Component Value Date   PSA 0.9 03/08/2018   PSA 0.8 03/05/2017   PSA 0.82 03/01/2016    No results found for: TESTOSTERONE  Lab Results  Component Value Date   HGBA1C 5.3 03/08/2018    Lab Results  Component Value Date   TSH 1.17 03/01/2016       Component Value Date/Time   CHOL 181 08/26/2018 0845   CHOL 242 (H) 03/01/2015 1050   HDL 61 08/26/2018 0845   HDL 55 03/01/2015 1050   CHOLHDL 3.0 08/26/2018 0845   VLDL 16 03/05/2017 0928   LDLCALC 102 (H) 08/26/2018 0845    Lab Results  Component Value Date   AST 17 08/26/2018   Lab Results  Component Value Date   ALT 14 08/26/2018   No components found for: ALKALINEPHOPHATASE No components found for: BILIRUBINTOTAL  No results found for: ESTRADIOL  Urinalysis Component     Latest Ref Rng & Units 04/25/2019  Specific Gravity, UA     1.005 - 1.030 1.015  pH, UA     5.0 - 7.5 7.5  Color, UA     Yellow Straw  Appearance Ur     Clear Clear  Leukocytes,UA     Negative 2+ (A)  Protein,UA     Negative/Trace Negative  Glucose, UA     Negative Negative  Ketones, UA     Negative Negative  RBC, UA     Negative Negative  Bilirubin, UA     Negative Negative  Urobilinogen, Ur     0.2 - 1.0 mg/dL 0.2  Nitrite, UA     Negative Positive (A)  Microscopic Examination      See below:   Component     Latest Ref Rng & Units 04/25/2019  WBC, UA     0 - 5 /hpf 6-10 (A)  RBC     0 - 2 /hpf None seen  Epithelial Cells (non renal)     0 - 10 /hpf 0-10  Bacteria, UA     None seen/Few Moderate (A)    I have reviewed the labs.   Pertinent Imaging: Results for Cobb, Peyton BottomsRVIS L (MRN [id number]) as of 05/02/2019  11:57  Ref. Range 04/25/2019 10:23  Scan Result Unknown 0     Assessment & Plan:    1. Gross hematuria Resolved - may have been due to catheter trauma or infection Continue to monitor for hematuria RTC in one month for UA  2. Epididymitis Resolving - continue Septra - urine sent for culture RTC in one month  for recheck   3. Urinary retention Managed with CIC   Return in about 1 month (around 05/26/2019) for recheck .  These notes generated with voice recognition software. I apologize for typographical errors.  Zara Council, PA-C  [facility] [address]  [place], [place] [address] [phone]

## 2019-04-25 ENCOUNTER — Ambulatory Visit (INDEPENDENT_AMBULATORY_CARE_PROVIDER_SITE_OTHER): Payer: Managed Care, Other (non HMO) | Admitting: Urology

## 2019-04-25 ENCOUNTER — Other Ambulatory Visit: Payer: Self-pay

## 2019-04-25 ENCOUNTER — Encounter: Payer: Self-pay | Admitting: Urology

## 2019-04-25 VITALS — BP 138/80 | HR 79 | Ht 71.0 in | Wt 187.0 lb

## 2019-04-25 DIAGNOSIS — N451 Epididymitis: Secondary | ICD-10-CM

## 2019-04-25 DIAGNOSIS — R339 Retention of urine, unspecified: Secondary | ICD-10-CM

## 2019-04-25 DIAGNOSIS — R310 Gross hematuria: Secondary | ICD-10-CM

## 2019-04-25 LAB — MICROSCOPIC EXAMINATION: RBC, Urine: NONE SEEN /hpf (ref 0–2)

## 2019-04-25 LAB — URINALYSIS, COMPLETE
Bilirubin, UA: NEGATIVE
Glucose, UA: NEGATIVE
Ketones, UA: NEGATIVE
Nitrite, UA: POSITIVE — AB
Protein,UA: NEGATIVE
RBC, UA: NEGATIVE
Specific Gravity, UA: 1.015 (ref 1.005–1.030)
Urobilinogen, Ur: 0.2 mg/dL (ref 0.2–1.0)
pH, UA: 7.5 (ref 5.0–7.5)

## 2019-04-25 LAB — BLADDER SCAN AMB NON-IMAGING: Scan Result: 0

## 2019-04-28 LAB — CULTURE, URINE COMPREHENSIVE

## 2019-04-28 NOTE — Telephone Encounter (Signed)
Pt states he no longer needs new RX for supplies

## 2019-05-27 ENCOUNTER — Ambulatory Visit: Payer: Managed Care, Other (non HMO) | Admitting: Urology

## 2019-08-12 ENCOUNTER — Telehealth: Payer: Self-pay | Admitting: Urology

## 2019-08-12 NOTE — Telephone Encounter (Signed)
Mr. Mitchell Doyle is due for his yearly visit for his PSA, prostate exam and we need to recheck his urine for microscopic blood. Please call him and schedule him for an appointment.

## 2019-08-13 NOTE — Telephone Encounter (Signed)
I called and spoke to pt, he refused to make a follow up appt.

## 2019-08-15 NOTE — Telephone Encounter (Signed)
Mitchell Doyle is having issues with his testicle.  He needs to be seen in the office for an exam next week.

## 2019-08-20 ENCOUNTER — Ambulatory Visit: Payer: Managed Care, Other (non HMO) | Admitting: Urology

## 2019-09-08 ENCOUNTER — Encounter: Payer: Managed Care, Other (non HMO) | Admitting: Family Medicine

## 2019-09-16 ENCOUNTER — Other Ambulatory Visit: Payer: Self-pay

## 2019-09-16 ENCOUNTER — Ambulatory Visit (INDEPENDENT_AMBULATORY_CARE_PROVIDER_SITE_OTHER): Payer: Managed Care, Other (non HMO) | Admitting: Family Medicine

## 2019-09-16 ENCOUNTER — Encounter: Payer: Self-pay | Admitting: Family Medicine

## 2019-09-16 VITALS — BP 130/70 | HR 88 | Temp 97.5°F | Resp 16 | Ht 70.5 in | Wt 186.1 lb

## 2019-09-16 DIAGNOSIS — Z1159 Encounter for screening for other viral diseases: Secondary | ICD-10-CM

## 2019-09-16 DIAGNOSIS — Z23 Encounter for immunization: Secondary | ICD-10-CM

## 2019-09-16 DIAGNOSIS — N2581 Secondary hyperparathyroidism of renal origin: Secondary | ICD-10-CM | POA: Diagnosis not present

## 2019-09-16 DIAGNOSIS — I700 Atherosclerosis of aorta: Secondary | ICD-10-CM

## 2019-09-16 DIAGNOSIS — E559 Vitamin D deficiency, unspecified: Secondary | ICD-10-CM

## 2019-09-16 DIAGNOSIS — Z0000 Encounter for general adult medical examination without abnormal findings: Secondary | ICD-10-CM

## 2019-09-16 DIAGNOSIS — Z131 Encounter for screening for diabetes mellitus: Secondary | ICD-10-CM

## 2019-09-16 DIAGNOSIS — N1831 Chronic kidney disease, stage 3a: Secondary | ICD-10-CM

## 2019-09-16 DIAGNOSIS — E785 Hyperlipidemia, unspecified: Secondary | ICD-10-CM

## 2019-09-16 MED ORDER — ATORVASTATIN CALCIUM 10 MG PO TABS: 10.0000 mg | ORAL_TABLET | Freq: Every day | ORAL | 3 refills | Status: DC

## 2019-09-16 NOTE — Progress Notes (Signed)
Name: Mitchell Doyle   MRN: [id number]    DOB: [birth year]   Date:09/16/2019       Progress Note  Subjective  Chief Complaint  Chief Complaint  Patient presents with  . Annual Exam  . Follow-up    HPI  Patient presents for annual CPE and follow up  CKI: he had a GFR of 33 June 2018 with elevation of potassium level. His CKI was secondary to left kidney hydronephrosis because of urinary retention, still sees nephrologist and Urologist.Reviewed labs done in Dec 2020 done by Dr. Cherylann Ratel   Atherosclerosis of aorta: discussed CT scan done, he has hyperlipidemia, discussed aspirin and also statin therapy with patient. Last LDL improved with plant base diet The 10-year ASCVD risk score Denman George DC Jr., et al., 2013) is: 6%   Values used to calculate the score:     Age: 54 years     Sex: Male     Is Non-Hispanic African American: Yes     Diabetic: No     Tobacco smoker: No     Systolic Blood Pressure: 130 mmHg     Is BP treated: No     HDL Cholesterol: 61 mg/dL     Total Cholesterol: 181 mg/dL  BPH: history of TURP in 2016 at [facility] , he has been doing self calth 3 imes daily and states he does not mind doing it. Recent UTI that was treated with Septra in August 2020  Vitamin D deficiency: discussed otc supplementation    USPSTF grade A and B recommendations:  Diet: vegan Exercise: he is active   Depression: phq 9 is negative Depression screen [facility] 2/9 09/16/2019 03/07/2019 03/08/2018 10/12/2017 08/31/2016  Decreased Interest 0 0 0 0 0  Down, Depressed, Hopeless 0 0 0 0 0  PHQ - 2 Score 0 0 0 0 0  Altered sleeping 0 0 - - -  Tired, decreased energy 0 0 - - -  Change in appetite 0 0 - - -  Feeling bad or failure about yourself  0 0 - - -  Trouble concentrating 0 0 - - -  Moving slowly or fidgety/restless 0 0 - - -  Suicidal thoughts 0 0 - - -  PHQ-9 Score 0 0 - - -    Hypertension:  BP Readings from Last 3 Encounters:  09/16/19 130/70  04/25/19 138/80  08/26/18 130/76     Obesity: Wt Readings from Last 3 Encounters:  09/16/19 186 lb 1.6 oz (84.4 kg)  04/25/19 187 lb (84.8 kg)  08/26/18 192 lb 8 oz (87.3 kg)   BMI Readings from Last 3 Encounters:  09/16/19 26.33 kg/m  04/25/19 26.08 kg/m  08/26/18 26.85 kg/m     Lipids:  Lab Results  Component Value Date   CHOL 181 08/26/2018   CHOL 227 (H) 03/08/2018   CHOL 199 03/05/2017   Lab Results  Component Value Date   HDL 61 08/26/2018   HDL 48 03/08/2018   HDL 48 03/05/2017   Lab Results  Component Value Date   LDLCALC 102 (H) 08/26/2018   LDLCALC 159 (H) 03/08/2018   LDLCALC 135 (H) 03/05/2017   Lab Results  Component Value Date   TRIG 85 08/26/2018   TRIG 94 03/08/2018   TRIG 79 03/05/2017   Lab Results  Component Value Date   CHOLHDL 3.0 08/26/2018   CHOLHDL 4.7 03/08/2018   CHOLHDL 4.1 03/05/2017   No results found for: LDLDIRECT Glucose:  Glucose, Bld  Date Value Ref  Range Status  08/26/2018 88 65 - 99 mg/dL Final    Comment:    .            Fasting reference interval .   03/08/2018 87 65 - 99 mg/dL Final    Comment:    .            Fasting reference interval .   03/05/2017 74 65 - 99 mg/dL Final      [facility] Visit from 09/16/2019 in [facility]  AUDIT-C Score  0       Married STD testing and prevention (HIV/chl/gon/syphilis): not interested  Hep C: was done 10/2017 by nephrologist   Skin cancer: Discussed monitoring for atypical lesions Colorectal cancer: repeat in 2023  Prostate cancer: sees Urologist   Lab Results  Component Value Date   PSA 0.9 03/08/2018   PSA 0.8 03/05/2017   PSA 0.82 03/01/2016     Lung cancer:  Low Dose CT Chest recommended if Age 76-80 years, 30 pack-year currently smoking OR have quit w/in 15years. Patient does not qualify.   ECG:  02/2018  Advanced Care Planning: A voluntary discussion about advance care planning including the explanation and discussion of advance directives.  Discussed health care  proxy and Living will, and the patient was able to identify a health care proxy as wife .  Patient does not have a living will at present time. If patient does have living will, I have requested they bring this to the clinic to be scanned in to their chart.  Patient Active Problem List   Diagnosis Date Noted  . Atherosclerosis of aorta (HCC) 08/26/2018  . Left renal atrophy 08/26/2018  . Hydronephrosis, left 03/08/2018  . Secondary hyperparathyroidism of renal origin (HCC) 03/08/2018  . Intermittent self-catheterization of bladder 03/08/2018  . History of epididymitis 03/08/2018  . Chronic kidney disease (CKD), stage III (moderate) 03/06/2017  . Internal hemorrhoids without complication 03/05/2017  . Personal history of colonic polyps   . Benign prostatic hyperplasia with urinary obstruction 02/27/2015  . History of urinary retention 02/27/2015  . Vitamin D deficiency 08/10/2009  . Dyslipidemia 02/11/2007    Past Surgical History:  Procedure Laterality Date  . COLONOSCOPY WITH PROPOFOL N/A 02/02/2017   Procedure: COLONOSCOPY WITH PROPOFOL;  Surgeon: Midge Minium, MD;  Location: [facility];  Service: Endoscopy;  Laterality: N/A;  . TONSILLECTOMY    . transurethral incision of bladder neck Bilateral 12/20/2008    Family History  Problem Relation Age of Onset  . Hypertension Mother   . Hypertension Father   . Prostate cancer Neg Hx   . Kidney cancer Neg Hx   . Bladder Cancer Neg Hx     Social History   Socioeconomic History  . Marital status: Married    Spouse name: Vance Gather  . Number of children: 3  . Years of education: Not on file  . Highest education level: Bachelor's degree (e.g., BA, AB, BS)  Occupational History  . Not on file  Tobacco Use  . Smoking status: Never Smoker  . Smokeless tobacco: Never Used  Substance and Sexual Activity  . Alcohol use: No    Alcohol/week: 0.0 standard drinks  . Drug use: No  . Sexual activity: Yes    Partners: Female     Birth control/protection: None  Other Topics Concern  . Not on file  Social History Narrative   Patient stated that he worries about his boys (12, 86 & 65)   Social Determinants  of Health   Financial Resource Strain: Low Risk   . Difficulty of Paying Living Expenses: Not hard at all  Food Insecurity: No Food Insecurity  . Worried About Programme researcher, broadcasting/film/video in the Last Year: Never true  . Ran Out of Food in the Last Year: Never true  Transportation Needs: No Transportation Needs  . Lack of Transportation (Medical): No  . Lack of Transportation (Non-Medical): No  Physical Activity: Sufficiently Active  . Days of Exercise per Week: 3 days  . Minutes of Exercise per Session: 60 min  Stress: No Stress Concern Present  . Feeling of Stress : Not at all  Social Connections: Not Isolated  . Frequency of Communication with Friends and Family: More than three times a week  . Frequency of Social Gatherings with Friends and Family: More than three times a week  . Attends Religious Services: More than 4 times per year  . Active Member of Clubs or Organizations: Yes  . Attends Banker Meetings: More than 4 times per year  . Marital Status: Married  Catering manager Violence: Not At Risk  . Fear of Current or Ex-Partner: No  . Emotionally Abused: No  . Physically Abused: No  . Sexually Abused: No     Current Outpatient Medications:  .  Catheters (BARD COUDE TIP CATHETER) MISC, Use 4 times daily as needed, Disp: 300 each, Rfl: 12 .  Cholecalciferol (VITAMIN D) 2000 UNITS tablet, Take by mouth., Disp: , Rfl:  .  Cyanocobalamin (VITAMIN B-12 PO), Take by mouth., Disp: , Rfl:  .  finasteride (PROSCAR) 5 MG tablet, Take 1 tablet (5 mg total) by mouth daily., Disp: 90 tablet, Rfl: 1 .  MAGNESIUM OXIDE PO, Take by mouth., Disp: , Rfl:  .  Omega-3 Fatty Acids (FISH OIL) 1000 MG CAPS, Take 1 capsule (1,000 mg total) by mouth 2 (two) times daily., Disp: 60 capsule, Rfl: 0 .  tamsulosin  (FLOMAX) 0.4 MG CAPS capsule, TAKE 1 CAPSULE BY MOUTH DAILY, Disp: 90 capsule, Rfl: 3  No Known Allergies   ROS  Constitutional: Negative for fever or weight change.  Respiratory: Negative for cough and shortness of breath.   Cardiovascular: Negative for chest pain or palpitations.  Gastrointestinal: Negative for abdominal pain, no bowel changes.  Musculoskeletal: Negative for gait problem or joint swelling.  Skin: Negative for rash.  Neurological: Negative for dizziness or headache.  No other specific complaints in a complete review of systems (except as listed in HPI above).   Objective  Vitals:   09/16/19 0935  BP: 130/70  Pulse: 88  Resp: 16  Temp: (!) 97.5 F (36.4 C)  TempSrc: Temporal  SpO2: 99%  Weight: 186 lb 1.6 oz (84.4 kg)  Height: 5' 10.5" (1.791 m)    Body mass index is 26.33 kg/m.  Physical Exam  Constitutional: Patient appears well-developed and well-nourished. No distress.  HENT: Head: Normocephalic and atraumatic. Ears: B TMs ok, no erythema or effusion; Nose: Nose normal. Mouth/Throat: Oropharynx is clear and moist. No oropharyngeal exudate.  Eyes: Conjunctivae and EOM are normal. Pupils are equal, round, and reactive to light. No scleral icterus.  Neck: Normal range of motion. Neck supple. No JVD present. No thyromegaly present.  Cardiovascular: Normal rate, regular rhythm and normal heart sounds.  No murmur heard. No BLE edema. Pulmonary/Chest: Effort normal and breath sounds normal. No respiratory distress. Abdominal: Soft. Bowel sounds are normal, no distension. There is no tenderness. no masses MALE GENITALIA: Not done  RECTAL: under the care of Urologist  Musculoskeletal: Normal range of motion, no joint effusions. No gross deformities Neurological: he is alert and oriented to person, place, and time. No cranial nerve deficit. Coordination, balance, strength, speech and gait are normal.  Skin: Skin is warm and dry. No rash noted. No erythema.   Psychiatric: Patient has a normal mood and affect. behavior is normal. Judgment and thought content normal.  PHQ2/9: Depression screen [facility] 2/9 09/16/2019 03/07/2019 03/08/2018 10/12/2017 08/31/2016  Decreased Interest 0 0 0 0 0  Down, Depressed, Hopeless 0 0 0 0 0  PHQ - 2 Score 0 0 0 0 0  Altered sleeping 0 0 - - -  Tired, decreased energy 0 0 - - -  Change in appetite 0 0 - - -  Feeling bad or failure about yourself  0 0 - - -  Trouble concentrating 0 0 - - -  Moving slowly or fidgety/restless 0 0 - - -  Suicidal thoughts 0 0 - - -  PHQ-9 Score 0 0 - - -     Fall Risk: Fall Risk  09/16/2019 03/07/2019 08/26/2018 03/08/2018 10/12/2017  Falls in the past year? 0 0 0 No No  Number falls in past yr: 0 0 - - -  Injury with Fall? 0 0 - - -    Functional Status Survey: Is the patient deaf or have difficulty hearing?: No Does the patient have difficulty seeing, even when wearing glasses/contacts?: No Does the patient have difficulty concentrating, remembering, or making decisions?: No Does the patient have difficulty walking or climbing stairs?: No Does the patient have difficulty dressing or bathing?: No Does the patient have difficulty doing errands alone such as visiting a doctor's office or shopping?: No    Assessment & Plan  1. Well adult exam  - Lipid panel - Hemoglobin A1c  2. Need for Tdap vaccination  - Tdap vaccine greater than or equal to 7yo IM  3. Atherosclerosis of aorta ([place])  He is not on statin therapy   4. Secondary hyperparathyroidism of renal origin [facility])  Labs recently checked by nephrologist   5. Stage 3a chronic kidney disease   6. Vitamin D deficiency  Recently checked by nephrologist and normal   7. Dyslipidemia  - Lipid panel Start lipitor 10 mg daily   8. Diabetes mellitus screening  - Hemoglobin A1c   -Prostate cancer screening and PSA options (with potential risks and benefits of testing vs not testing) were discussed along with  recent recs/guidelines. -USPSTF grade A and B recommendations reviewed with patient; age-appropriate recommendations, preventive care, screening tests, etc discussed and encouraged; healthy living encouraged; see AVS for patient education given to patient -Discussed importance of 150 minutes of physical activity weekly, eat two servings of fish weekly, eat one serving of tree nuts ( cashews, pistachios, pecans, almonds.Marland Kitchen) every other day, eat 6 servings of fruit/vegetables daily and drink plenty of water and avoid sweet beverages.

## 2019-09-16 NOTE — Patient Instructions (Signed)
Preventive Care 41-55 Years Old, Male Preventive care refers to lifestyle choices and visits with your health care provider that can promote health and wellness. This includes:  A yearly physical exam. This is also called an annual well check.  Regular dental and eye exams.  Immunizations.  Screening for certain conditions.  Healthy lifestyle choices, such as eating a healthy diet, getting regular exercise, not using drugs or products that contain nicotine and tobacco, and limiting alcohol use. What can I expect for my preventive care visit? Physical exam Your health care provider will check:  Height and weight. These may be used to calculate body mass index (BMI), which is a measurement that tells if you are at a healthy weight.  Heart rate and blood pressure.  Your skin for abnormal spots. Counseling Your health care provider may ask you questions about:  Alcohol, tobacco, and drug use.  Emotional well-being.  Home and relationship well-being.  Sexual activity.  Eating habits.  Work and work Statistician. What immunizations do I need?  Influenza (flu) vaccine  This is recommended every year. Tetanus, diphtheria, and pertussis (Tdap) vaccine  You may need a Td booster every 10 years. Varicella (chickenpox) vaccine  You may need this vaccine if you have not already been vaccinated. Zoster (shingles) vaccine  You may need this after age 55. Measles, mumps, and rubella (MMR) vaccine  You may need at least one dose of MMR if you were born in 1957 or later. You may also need a second dose. Pneumococcal conjugate (PCV13) vaccine  You may need this if you have certain conditions and were not previously vaccinated. Pneumococcal polysaccharide (PPSV23) vaccine  You may need one or two doses if you smoke cigarettes or if you have certain conditions. Meningococcal conjugate (MenACWY) vaccine  You may need this if you have certain conditions. Hepatitis A  vaccine  You may need this if you have certain conditions or if you travel or work in places where you may be exposed to hepatitis A. Hepatitis B vaccine  You may need this if you have certain conditions or if you travel or work in places where you may be exposed to hepatitis B. Haemophilus influenzae type b (Hib) vaccine  You may need this if you have certain risk factors. Human papillomavirus (HPV) vaccine  If recommended by your health care provider, you may need three doses over 6 months. You may receive vaccines as individual doses or as more than one vaccine together in one shot (combination vaccines). Talk with your health care provider about the risks and benefits of combination vaccines. What tests do I need? Blood tests  Lipid and cholesterol levels. These may be checked every 5 years, or more frequently if you are over 60 years old.  Hepatitis C test.  Hepatitis B test. Screening  Lung cancer screening. You may have this screening every year starting at age 55 if you have a 30-pack-year history of smoking and currently smoke or have quit within the past 15 years.  Prostate cancer screening. Recommendations will vary depending on your family history and other risks.  Colorectal cancer screening. All adults should have this screening starting at age 55 and continuing until age 2. and continuing until age 2. Your health care provider may recommend screening at age 55 if you are at increased risk. You will have tests every 1-10 years, depending on your results and the type of screening test.  Diabetes screening. This is done by checking your blood sugar (glucose) after you have not eaten  for a while (fasting). You may have this done every 1-3 years.  Sexually transmitted disease (STD) testing. Follow these instructions at home: Eating and drinking  Eat a diet that includes fresh fruits and vegetables, whole grains, lean protein, and low-fat dairy products.  Take vitamin and mineral supplements as  recommended by your health care provider.  Do not drink alcohol if your health care provider tells you not to drink.  If you drink alcohol: ? Limit how much you have to 0-2 drinks a day. ? Be aware of how much alcohol is in your drink. In the [place] one drink equals one 12 oz bottle of beer (355 mL), one 5 oz glass of wine (148 mL), or one 1 oz glass of hard liquor (44 mL). Lifestyle  Take daily care of your teeth and gums.  Stay active. Exercise for at least 30 minutes on 5 or more days each week.  Do not use any products that contain nicotine or tobacco, such as cigarettes, e-cigarettes, and chewing tobacco. If you need help quitting, ask your health care provider.  If you are sexually active, practice safe sex. Use a condom or other form of protection to prevent STIs (sexually transmitted infections).  Talk with your health care provider about taking a low-dose aspirin every day starting at age 55. What's next?  Go to your health care provider once a year for a well check visit.  Ask your health care provider how often you should have your eyes and teeth checked.  Stay up to date on all vaccines. This information is not intended to replace advice given to you by your health care provider. Make sure you discuss any questions you have with your health care provider. Document Revised: 08/22/2018 Document Reviewed: 08/22/2018 Elsevier Patient Education  2020 [employer].

## 2019-09-17 LAB — HEMOGLOBIN A1C
Hgb A1c MFr Bld: 5.2 % of total Hgb (ref <5.7)
Mean Plasma Glucose: 103 (calc)
eAG (mmol/L): 5.7 (calc)

## 2019-09-17 LAB — LIPID PANEL
Cholesterol: 187 mg/dL (ref <200)
HDL: 59 mg/dL (ref >=40)
LDL Cholesterol (Calc): 108 mg/dL (calc) — ABNORMAL HIGH
Non-HDL Cholesterol (Calc): 128 mg/dL (calc) (ref <130)
Total CHOL/HDL Ratio: 3.2 (calc) (ref <5.0)
Triglycerides: 100 mg/dL (ref <150)

## 2019-09-19 ENCOUNTER — Telehealth: Payer: Self-pay | Admitting: Urology

## 2019-09-19 NOTE — Telephone Encounter (Signed)
LMOM for pt to call back to schedule appt

## 2019-09-19 NOTE — Telephone Encounter (Signed)
Please call Mitchell Doyle and have him reschedule his missed appointment for a swollen testicle and hematuria.

## 2019-10-01 IMAGING — US US RENAL
1 series · 14 of 25 positions shown · non-contrast
Comparison: Renal ultrasound dated 08/26/2014

CLINICAL DATA: Chronic kidney disease, stage III.

EXAM:
RENAL / URINARY TRACT ULTRASOUND COMPLETE

[Series 1: us renal · 0.25mm/px · 14 of 41 slices shown]
[im 1/41]
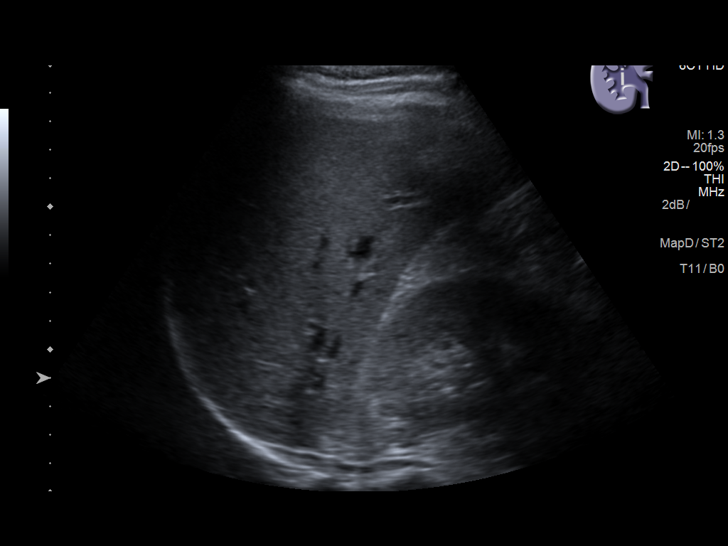
[im 4/41]
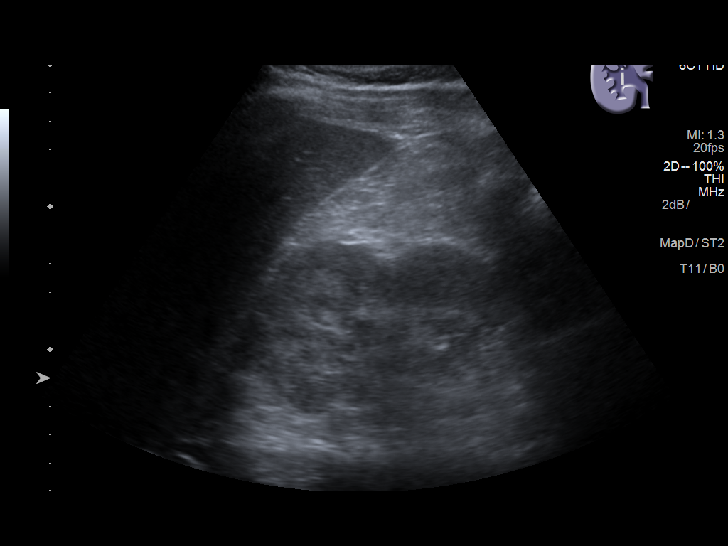
[im 7/41]
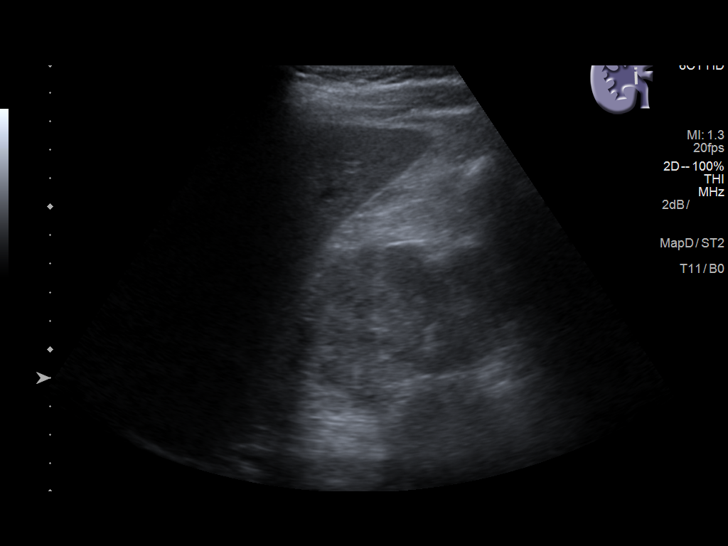
[im 11/41]
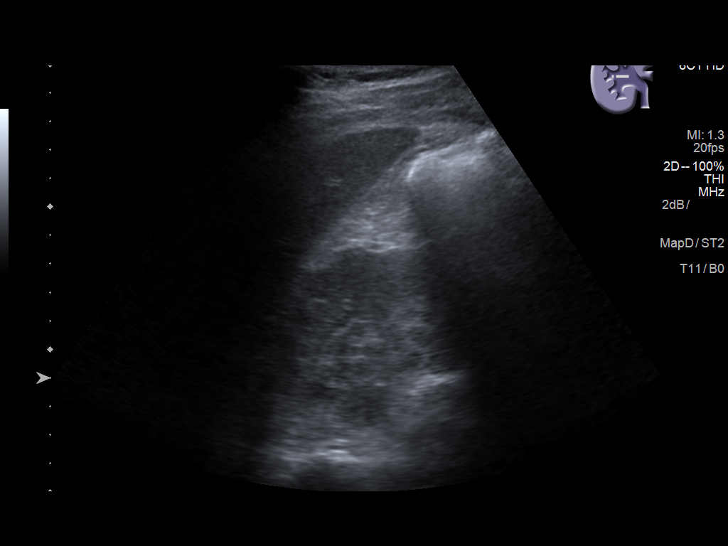
[im 14/41]
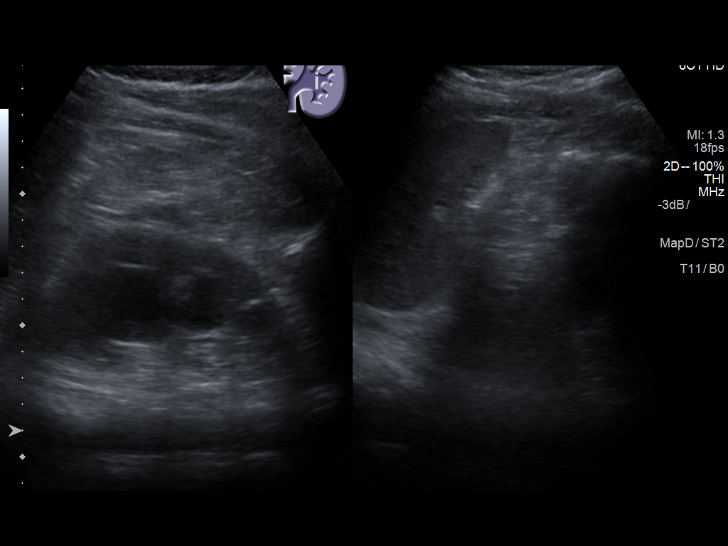
[im 16/41]
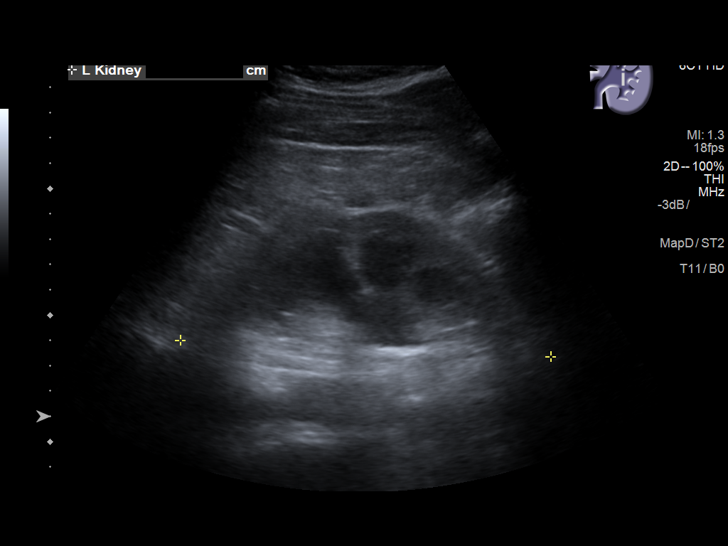
[im 19/41]
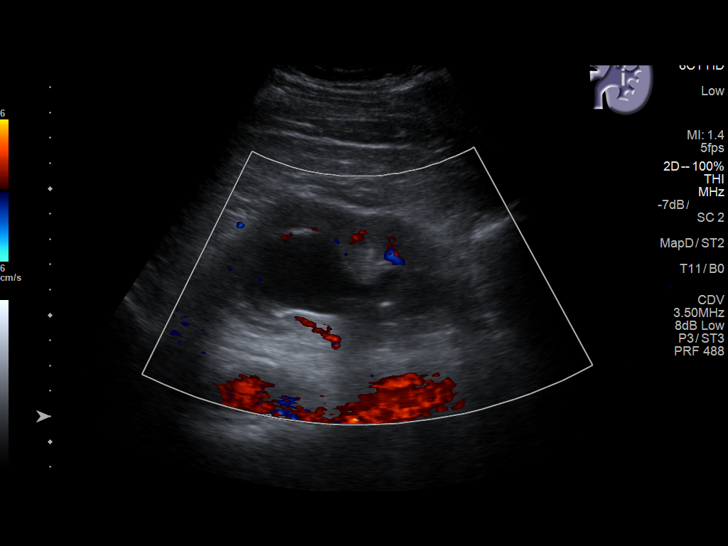
[im 22/41]
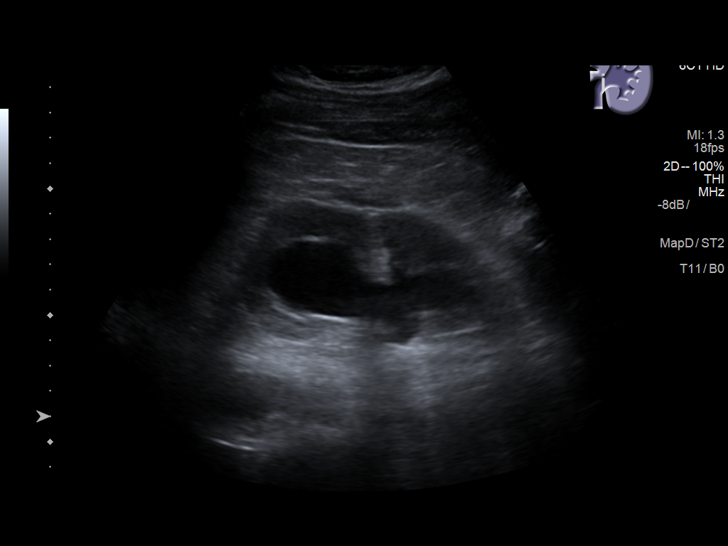
[im 26/41]
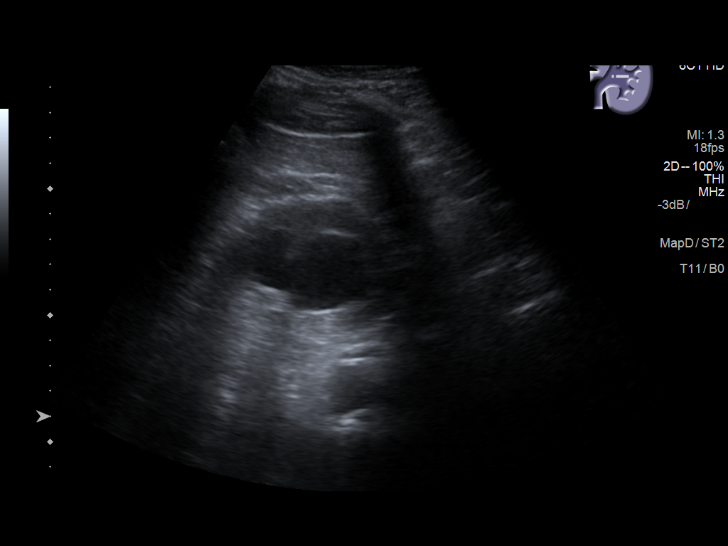
[im 27/41]
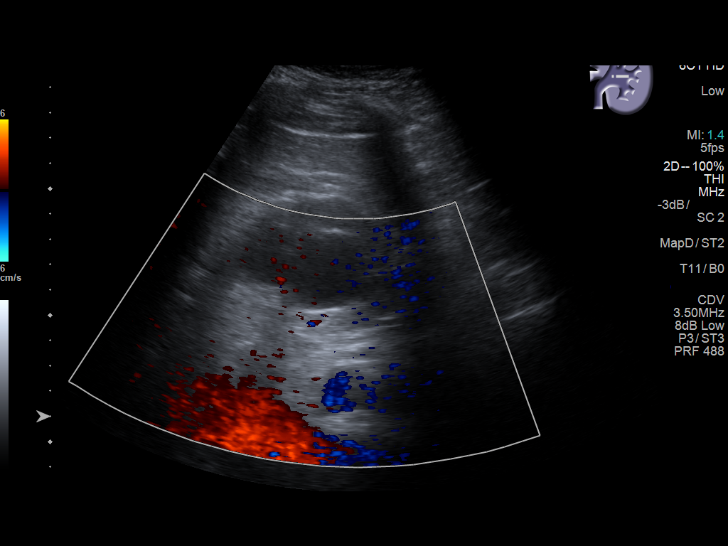
[im 31/41]
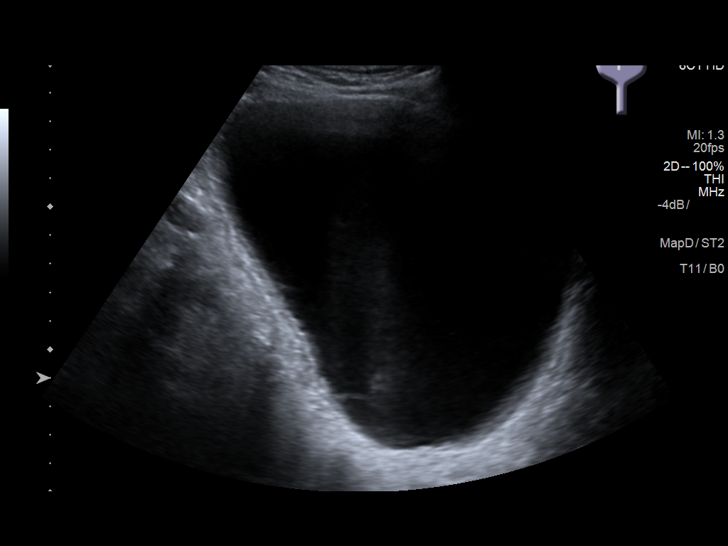
[im 34/41]
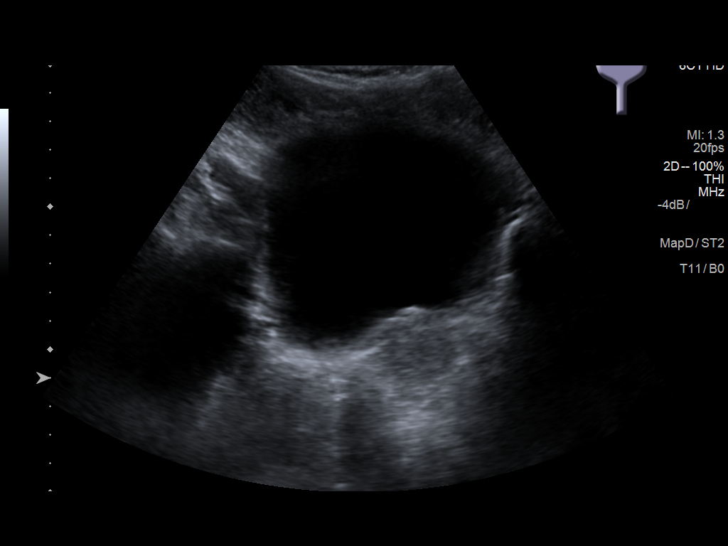
[im 37/41]
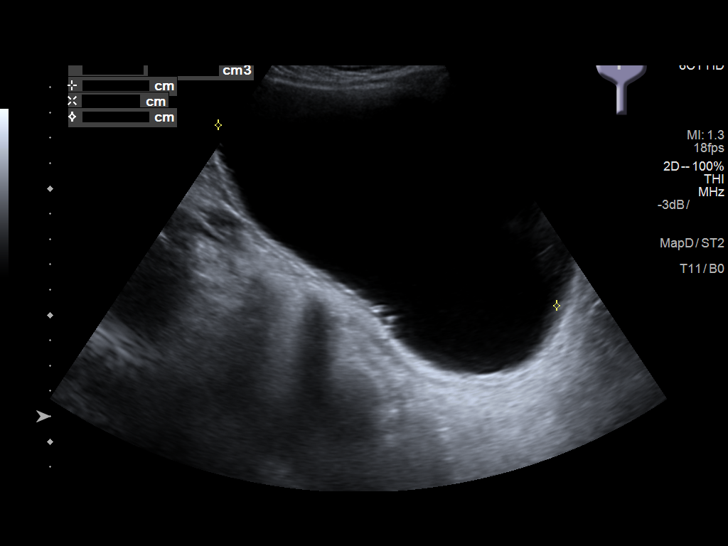
[im 41/41]
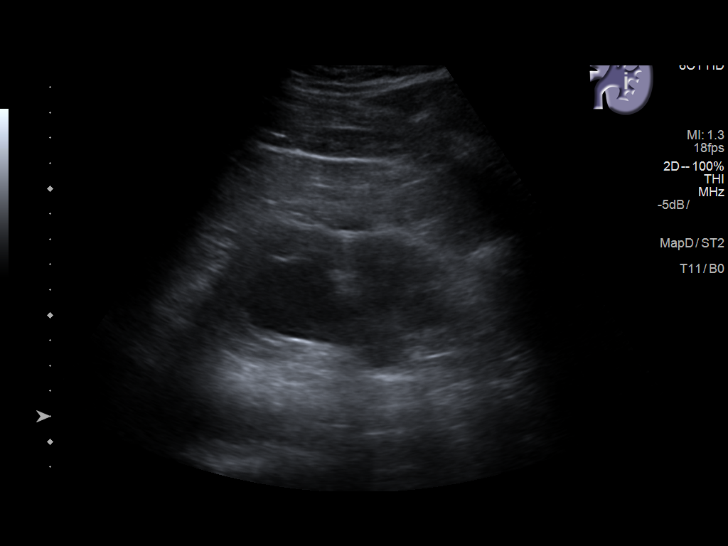

[14 of 25 positions shown; findings below may reference images not displayed]

FINDINGS: Right Kidney:

Length: 10.2 cm. Echogenicity within normal limits. No mass or
hydronephrosis visualized.

Left Kidney:

Length: 14.6 cm. Echogenicity within normal limits. Moderate
hydronephrosis, increased compared to the previous study.

Bladder:

Appears normal for degree of bladder distention.

Prevoid bladder volume measurement is 791 cc. Patient attempted to
void, however, postvoid bladder volume is 800 cc.
IMPRESSION: 1. Moderate left-sided hydronephrosis, increased compared to the
previous study of 08/26/2014. Consider CT for further
characterization.
2. Normal right kidney.
3. Large postvoid residual in the bladder, possibly contributing to
the left-sided hydronephrosis.

## 2019-10-07 ENCOUNTER — Encounter: Payer: Self-pay | Admitting: Urology

## 2019-11-13 ENCOUNTER — Encounter: Payer: Self-pay | Admitting: Family Medicine

## 2019-12-31 ENCOUNTER — Other Ambulatory Visit: Payer: Self-pay | Admitting: Family Medicine

## 2019-12-31 DIAGNOSIS — N138 Other obstructive and reflux uropathy: Secondary | ICD-10-CM

## 2019-12-31 NOTE — Telephone Encounter (Signed)
Requested Prescriptions  Pending Prescriptions Disp Refills  . finasteride (PROSCAR) 5 MG tablet [Pharmacy Med Name: FINASTERIDE TABS 5MG ] 90 tablet 2    Sig: TAKE 1 TABLET BY MOUTH DAILY     Urology: 5-alpha Reductase Inhibitors Passed - 12/31/2019 12:30 AM      Passed - Valid encounter within last 12 months    Recent Outpatient Visits          3 months ago Well adult exam   [facility], MD   9 months ago Atherosclerosis of aorta [facility])   [facility], MD   1 year ago Atherosclerosis of aorta [facility])   [facility], MD   1 year ago Encounter for routine history and physical exam for male   [facility], [place]   2 years ago Chronic kidney insufficiency, stage 3 (moderate) [facility])   [facility], MD      Future Appointments            In 2 months Alba Cory, Carlynn Purl, MD [facility]   In 8 months [facility], MD [facility], [facility]

## 2020-02-13 IMAGING — US US RENAL
1 series · 13 of 25 positions shown · non-contrast
Comparison: Renal ultrasound November 09, 2017

CLINICAL DATA: Follow-up hydronephrosis. The patient had an
indwelling catheter for 2 weeks and now is doing in and out
catheterization.

EXAM:
RENAL / URINARY TRACT ULTRASOUND COMPLETE

[Series 1: us renal · 13 of 48 slices shown]
[im 1/48]
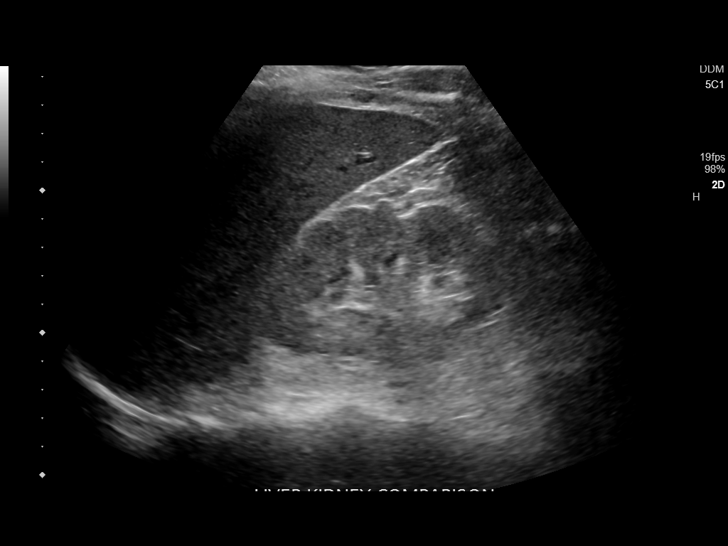
[im 4/48]
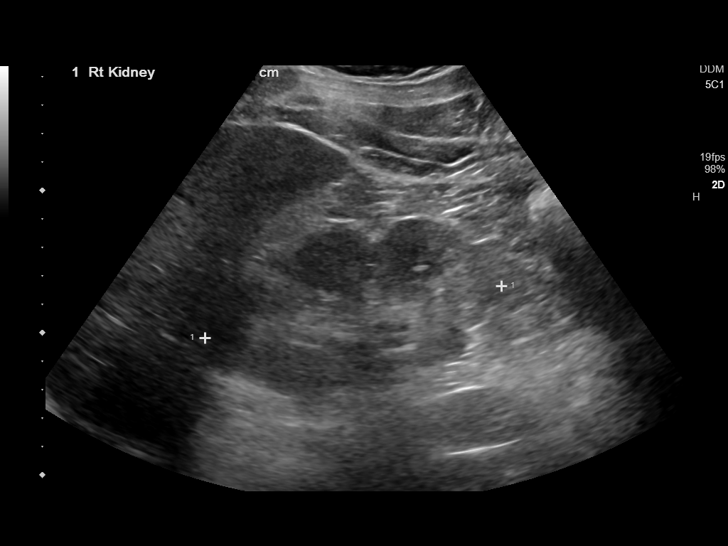
[im 8/48]
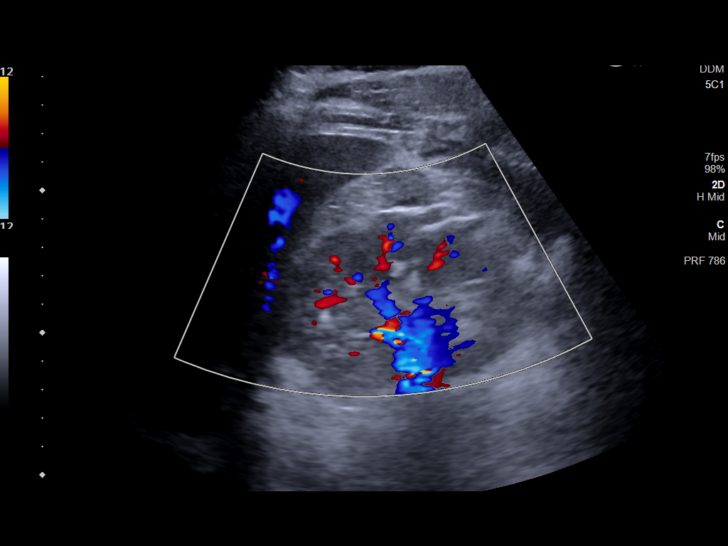
[im 12/48]
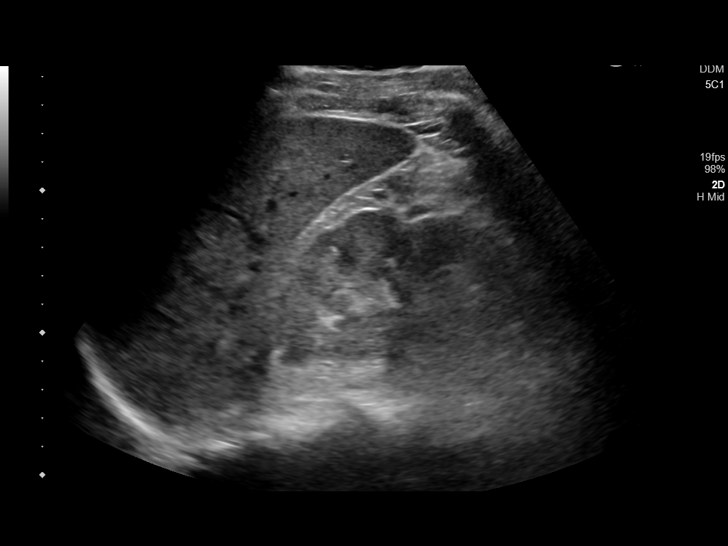
[im 16/48]
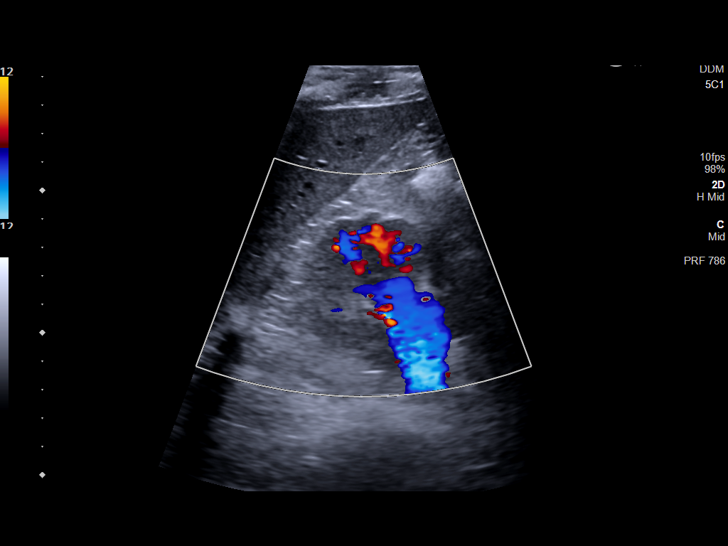
[im 20/48]
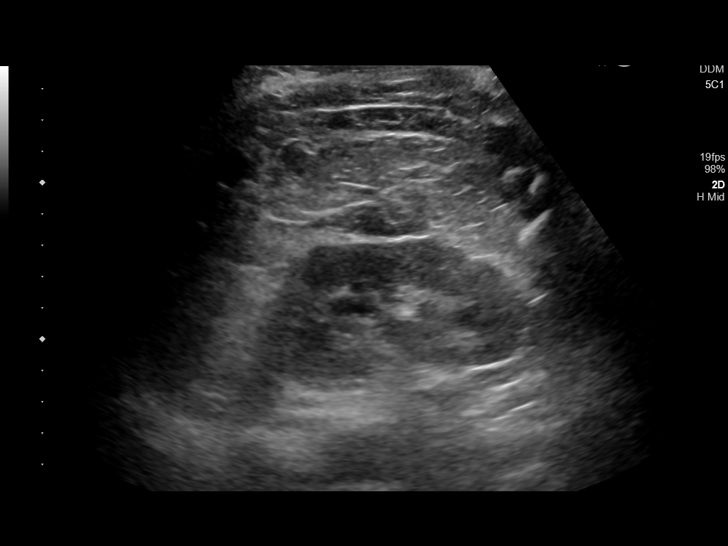
[im 24/48]
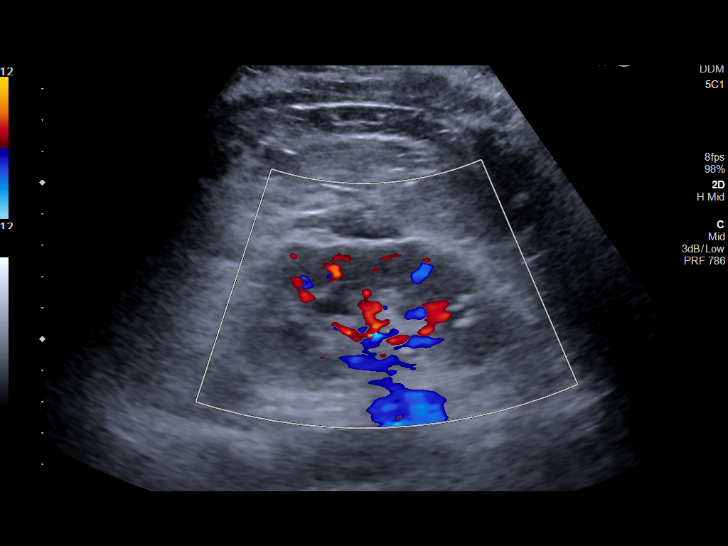
[im 28/48]
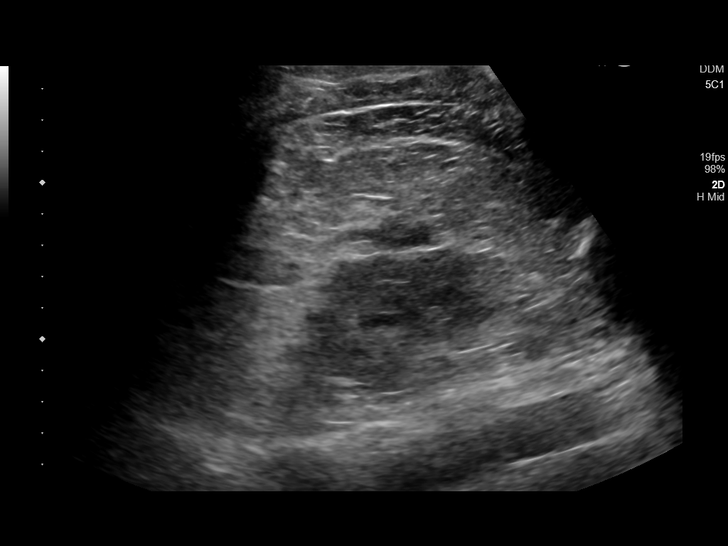
[im 32/48]
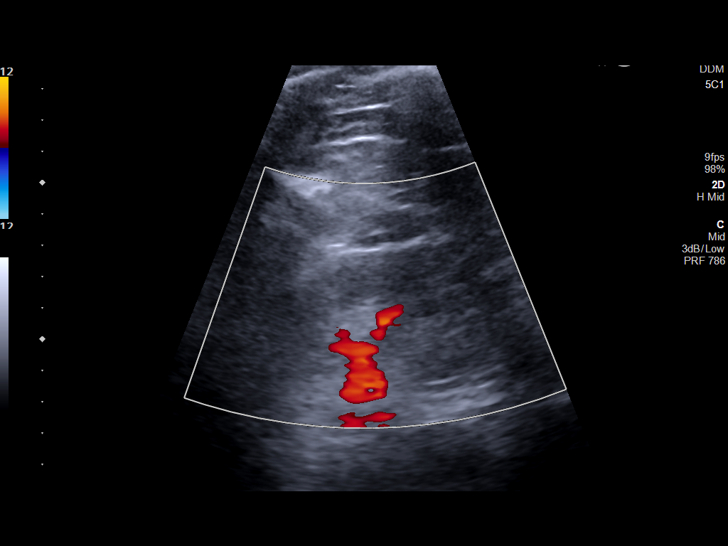
[im 36/48]
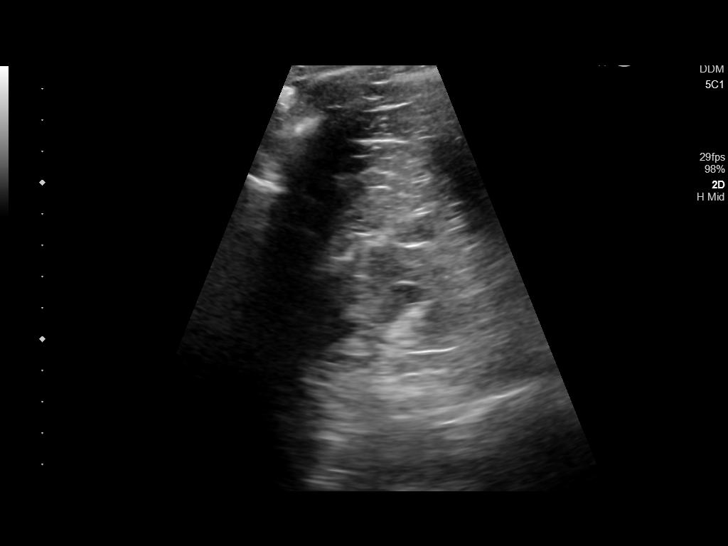
[im 40/48]
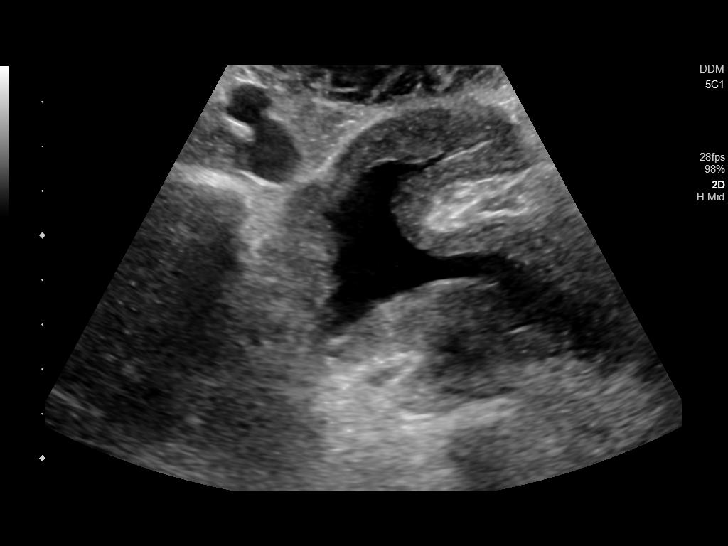
[im 44/48]
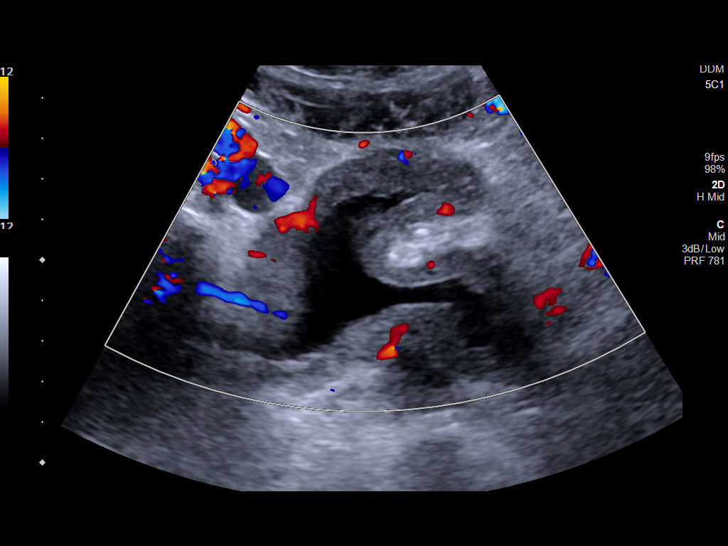
[im 48/48]
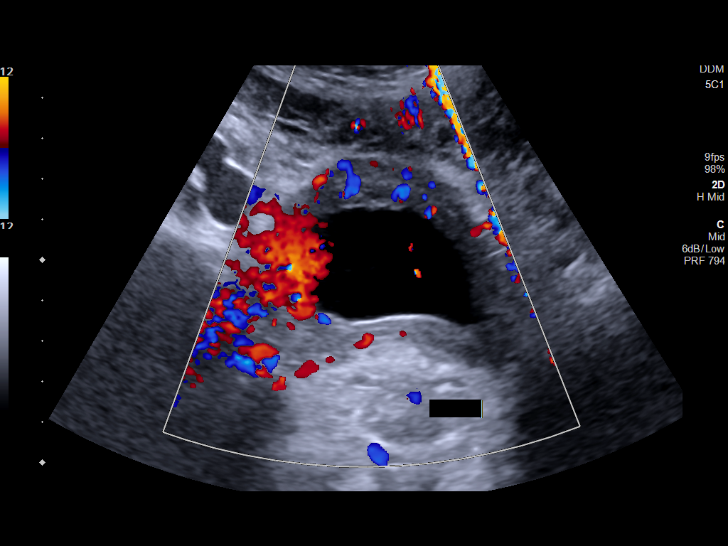

[13 of 25 positions shown; findings below may reference images not displayed]

FINDINGS: Right Kidney:

Length: 10.6 cm. The renal cortical echotexture remains slightly
lower than that of the liver but appears abnormal. The cortical
contour is somewhat lobulated. There is no hydronephrosis.

Left Kidney:

Length: 8.9 cm. The reach the renal cortical echotexture is mildly
increased similar to that on the right. There is mild dilation of
the upper and lower pole calices. The prominent hydronephrosis seen
previously is not evident today.

Bladder:

The urinary bladder wall is irregularly thickened. It measures as
much as 1.2 cm in thickness. Bilateral ureteral jets are observed
with the right jet more vigorous than the left.
IMPRESSION: Decreased left-sided hydronephrosis. Bilateral ureteral jets are
observed though the left jet is weaker than the right. Mildly
increased renal cortical echoes texture bilaterally still slightly
lower than that of the adjacent liver. This is compatible with
medical renal disease.

Thick walled partially distended urinary bladder.

## 2020-03-16 ENCOUNTER — Encounter: Payer: Self-pay | Admitting: Family Medicine

## 2020-03-16 ENCOUNTER — Other Ambulatory Visit: Payer: Self-pay

## 2020-03-16 ENCOUNTER — Ambulatory Visit (INDEPENDENT_AMBULATORY_CARE_PROVIDER_SITE_OTHER): Payer: Managed Care, Other (non HMO) | Admitting: Family Medicine

## 2020-03-16 VITALS — BP 132/84 | HR 71 | Temp 97.7°F | Resp 18 | Ht 70.5 in | Wt 183.0 lb

## 2020-03-16 DIAGNOSIS — I700 Atherosclerosis of aorta: Secondary | ICD-10-CM | POA: Diagnosis not present

## 2020-03-16 DIAGNOSIS — E785 Hyperlipidemia, unspecified: Secondary | ICD-10-CM | POA: Diagnosis not present

## 2020-03-16 DIAGNOSIS — E559 Vitamin D deficiency, unspecified: Secondary | ICD-10-CM

## 2020-03-16 DIAGNOSIS — N138 Other obstructive and reflux uropathy: Secondary | ICD-10-CM

## 2020-03-16 DIAGNOSIS — N401 Enlarged prostate with lower urinary tract symptoms: Secondary | ICD-10-CM

## 2020-03-16 DIAGNOSIS — N1831 Chronic kidney disease, stage 3a: Secondary | ICD-10-CM | POA: Diagnosis not present

## 2020-03-16 MED ORDER — TAMSULOSIN HCL 0.4 MG PO CAPS: 0.4000 mg | ORAL_CAPSULE | Freq: Every day | ORAL | 3 refills | Status: DC

## 2020-03-16 NOTE — Progress Notes (Signed)
Name: Mitchell Doyle   MRN: [id number]    DOB: [birth year]   Date:03/16/2020       Progress Note  Subjective  Chief Complaint  Chief Complaint  Patient presents with  . Follow-up    6 month recheck  . Benign Prostatic Hypertrophy    medication refills    HPI  CKI: he had a GFR of 33 June 2018 with elevation of potassium level. HisCKIwassecondary to left kidney hydronephrosis because of urinary retention, still sees nephrologist and Urologist.Reviewed labs done in April 2021  done by Dr. Cherylann Ratel , normal Pth, GFR 44, urine micro negative.   Atherosclerosis of aorta: discussed CT scan done, he has hyperlipidemia, discussed aspirin and also statin therapywith patient, we gave him a rx because even though ASCVD is low, that did not add in consideration the fact that he has plaque build up on aorta.   The 10-year ASCVD risk score Denman George DC Montez Hageman., et al., 2013) is: 6.3%   Values used to calculate the score:     Age: 54 years     Sex: Male     Is Non-Hispanic African American: Yes     Diabetic: No     Tobacco smoker: No     Systolic Blood Pressure: 132 mmHg     Is BP treated: No     HDL Cholesterol: 59 mg/dL     Total Cholesterol: 187 mg/dL   BPH: history of TURP in 2016 at [facility] , he has been doing self calth 3 times daily and states he does not mind doingit. No recent episodes of UTI. Last one was August 2020   Vitamin D deficiency: discussed otc supplementation   Patient Active Problem List   Diagnosis Date Noted  . Atherosclerosis of aorta (HCC) 08/26/2018  . Left renal atrophy 08/26/2018  . Hydronephrosis, left 03/08/2018  . Secondary hyperparathyroidism of renal origin (HCC) 03/08/2018  . Intermittent self-catheterization of bladder 03/08/2018  . History of epididymitis 03/08/2018  . Chronic kidney disease (CKD), stage III (moderate) 03/06/2017  . Internal hemorrhoids without complication 03/05/2017  . Personal history of colonic polyps   . Benign prostatic hyperplasia  with urinary obstruction 02/27/2015  . History of urinary retention 02/27/2015  . Vitamin D deficiency 08/10/2009  . Dyslipidemia 02/11/2007    Past Surgical History:  Procedure Laterality Date  . COLONOSCOPY WITH PROPOFOL N/A 02/02/2017   Procedure: COLONOSCOPY WITH PROPOFOL;  Surgeon: Midge Minium, MD;  Location: [facility];  Service: Endoscopy;  Laterality: N/A;  . TONSILLECTOMY    . transurethral incision of bladder neck Bilateral 12/20/2008    Family History  Problem Relation Age of Onset  . Hypertension Mother   . Hypertension Father   . Prostate cancer Neg Hx   . Kidney cancer Neg Hx   . Bladder Cancer Neg Hx     Social History   Tobacco Use  . Smoking status: Never Smoker  . Smokeless tobacco: Never Used  Substance Use Topics  . Alcohol use: No    Alcohol/week: 0.0 standard drinks     Current Outpatient Medications:  .  Cholecalciferol (VITAMIN D) 2000 UNITS tablet, Take by mouth., Disp: , Rfl:  .  Cyanocobalamin (VITAMIN B-12 PO), Take by mouth., Disp: , Rfl:  .  finasteride (PROSCAR) 5 MG tablet, TAKE 1 TABLET BY MOUTH DAILY, Disp: 90 tablet, Rfl: 2 .  MAGNESIUM OXIDE PO, Take by mouth., Disp: , Rfl:  .  Omega-3 Fatty Acids (FISH OIL) 1000 MG CAPS, Take  1 capsule (1,000 mg total) by mouth 2 (two) times daily., Disp: 60 capsule, Rfl: 0 .  tamsulosin (FLOMAX) 0.4 MG CAPS capsule, TAKE 1 CAPSULE BY MOUTH DAILY, Disp: 90 capsule, Rfl: 3 .  atorvastatin (LIPITOR) 10 MG tablet, Take 1 tablet (10 mg total) by mouth daily., Disp: 90 tablet, Rfl: 3 .  Catheters (BARD COUDE TIP CATHETER) MISC, Use 4 times daily as needed (Patient not taking: Reported on 03/16/2020), Disp: 300 each, Rfl: 12  No Known Allergies  I personally reviewed active problem list, medication list, allergies, family history, social history, health maintenance with the patient/caregiver today.   ROS  Constitutional: Negative for fever or weight change.  Respiratory: Negative for cough and  shortness of breath.   Cardiovascular: Negative for chest pain or palpitations.  Gastrointestinal: Negative for abdominal pain, no bowel changes.  Musculoskeletal: Negative for gait problem or joint swelling.  Skin: Negative for rash.  Neurological: Negative for dizziness or headache.  No other specific complaints in a complete review of systems (except as listed in HPI above).  Objective  Vitals:   03/16/20 0804  Pulse: 71  Resp: 18  Temp: 97.7 F (36.5 C)  TempSrc: Temporal  SpO2: 99%  Weight: 183 lb (83 kg)  Height: 5' 10.5" (1.791 m)    Body mass index is 25.89 kg/m.  Physical Exam  Constitutional: Patient appears well-developed and well-nourished.  No distress.  HEENT: head atraumatic, normocephalic, pupils equal and reactive to light,  neck supple, throat within normal limits Cardiovascular: Normal rate, regular rhythm and normal heart sounds.  No murmur heard. No BLE edema. Pulmonary/Chest: Effort normal and breath sounds normal. No respiratory distress. Abdominal: Soft.  There is no tenderness. Psychiatric: Patient has a normal mood and affect. behavior is normal. Judgment and thought content normal.   PHQ2/9: Depression screen [facility] 2/9 03/16/2020 09/16/2019 03/07/2019 03/08/2018 10/12/2017  Decreased Interest 0 0 0 0 0  Down, Depressed, Hopeless 0 0 0 0 0  PHQ - 2 Score 0 0 0 0 0  Altered sleeping 0 0 0 - -  Tired, decreased energy 0 0 0 - -  Change in appetite 0 0 0 - -  Feeling bad or failure about yourself  0 0 0 - -  Trouble concentrating 0 0 0 - -  Moving slowly or fidgety/restless 0 0 0 - -  Suicidal thoughts 0 0 0 - -  PHQ-9 Score 0 0 0 - -  Difficult doing work/chores Not difficult at all - - - -    phq 9 is negative   Fall Risk: Fall Risk  03/16/2020 09/16/2019 03/07/2019 08/26/2018 03/08/2018  Falls in the past year? 0 0 0 0 No  Number falls in past yr: 0 0 0 - -  Injury with Fall? 0 0 0 - -  Follow up Falls evaluation completed - - - -    Assessment &  Plan  1. Benign prostatic hyperplasia with urinary obstruction  - tamsulosin (FLOMAX) 0.4 MG CAPS capsule; Take 1 capsule (0.4 mg total) by mouth daily.  Dispense: 90 capsule; Refill: 3  2. Dyslipidemia  Refuses statin therapy   3. Stage 3a chronic kidney disease  Keep follow up with Dr. Cherylann Ratel   4. Atherosclerosis of aorta (HCC)  Discussed trying aspirin 81 mg daily   5. Vitamin D deficiency  Continue vitamin D daily

## 2020-09-15 NOTE — Progress Notes (Signed)
Name: Mitchell Doyle   MRN: [id number]    DOB: [birth year]   Date:09/16/2020       Progress Note  Subjective  Chief Complaint  Annual Exam  HPI  Patient presents for annual CPE and follow up .  CKI: he had a GFR of 33 June 2018 with elevation of potassium level. HisCKIwassecondary to left kidney hydronephrosis because of urinary retention, still sees nephrologist Dr. Holley Raring but no longer seeing an Urologist.Reviewed labs done Dec 2021 and GFR is up to 45  done , parathyroid back to normal   Atherosclerosis of aorta: discussed CT scan done, he has hyperlipidemia, discussed aspirin and also statin therapywith patient, he continues to refuse therapy   The 10-year ASCVD risk score Mikey Bussing DC Brooke Bonito., et al., 2013) is: 9%   Values used to calculate the score:     Age: 1 years     Sex: Male     Is Non-Hispanic African American: Yes     Diabetic: No     Tobacco smoker: No     Systolic Blood Pressure: 341 mmHg     Is BP treated: No     HDL Cholesterol: 59 mg/dL     Total Cholesterol: 187 mg/dL   BPH: history of TURP in 2016 at [facility] , he has been doing self calth 3 times daily and states he does not mind doingit.No recent episodes of UTI. He is no longer seeing Urologist , he wants to find a physical he trusts, does not want to go to [facility], discussed going back to [facility], he wants to hold off for now  Vitamin D deficiency: taking otc supplementation    IPSS Questionnaire (AUA-7): Over the past month.   1)  How often have you had a sensation of not emptying your bladder completely after you finish urinating?  0 - Not at all  2)  How often have you had to urinate again less than two hours after you finished urinating? 0 - Not at all  3)  How often have you found you stopped and started again several times when you urinated?  0 - Not at all  4) How difficult have you found it to postpone urination?  0 - Not at all  5) How often have you had a weak urinary stream?  0 - Not at  all  6) How often have you had to push or strain to begin urination?  0 - Not at all  7) How many times did you most typically get up to urinate from the time you went to bed until the time you got up in the morning?  0 - None  Total score:  0-7 mildly symptomatic   8-19 moderately symptomatic   20-35 severely symptomatic     He self caths  Diet: balanced Exercise: discussed 150 minutes weekly   Depression: phq 9 is negative Depression screen [facility] 2/9 09/16/2020 03/16/2020 09/16/2019 03/07/2019 03/08/2018  Decreased Interest 0 0 0 0 0  Down, Depressed, Hopeless 0 0 0 0 0  PHQ - 2 Score 0 0 0 0 0  Altered sleeping 0 0 0 0 -  Tired, decreased energy 0 0 0 0 -  Change in appetite 0 0 0 0 -  Feeling bad or failure about yourself  0 0 0 0 -  Trouble concentrating 0 0 0 0 -  Moving slowly or fidgety/restless 0 0 0 0 -  Suicidal thoughts 0 0 0 0 -  PHQ-9 Score  0 0 0 0 -  Difficult doing work/chores - Not difficult at all - - -    Hypertension:  BP Readings from Last 3 Encounters:  09/16/20 (!) 158/90  03/16/20 132/84  09/16/19 130/70    Obesity: Wt Readings from Last 3 Encounters:  09/16/20 181 lb 6.4 oz (82.3 kg)  03/16/20 183 lb (83 kg)  09/16/19 186 lb 1.6 oz (84.4 kg)   BMI Readings from Last 3 Encounters:  09/16/20 25.30 kg/m  03/16/20 25.89 kg/m  09/16/19 26.33 kg/m     Lipids:  Lab Results  Component Value Date   CHOL 187 09/16/2019   CHOL 181 08/26/2018   CHOL 227 (H) 03/08/2018   Lab Results  Component Value Date   HDL 59 09/16/2019   HDL 61 08/26/2018   HDL 48 03/08/2018   Lab Results  Component Value Date   LDLCALC 108 (H) 09/16/2019   LDLCALC 102 (H) 08/26/2018   LDLCALC 159 (H) 03/08/2018   Lab Results  Component Value Date   TRIG 100 09/16/2019   TRIG 85 08/26/2018   TRIG 94 03/08/2018   Lab Results  Component Value Date   CHOLHDL 3.2 09/16/2019   CHOLHDL 3.0 08/26/2018   CHOLHDL 4.7 03/08/2018   No results found for: LDLDIRECT Glucose:   Glucose, Bld  Date Value Ref Range Status  08/26/2018 88 65 - 99 mg/dL Final    Comment:    .            Fasting reference interval .   03/08/2018 87 65 - 99 mg/dL Final    Comment:    .            Fasting reference interval .   03/05/2017 74 65 - 99 mg/dL Final    Flowsheet Row Office Visit from 09/16/2020 in [facility]  AUDIT-C Score 0       Married STD testing and prevention (HIV/chl/gon/syphilis): not interested  Hep C: Ordered at today's visit.   Skin cancer: Discussed monitoring for atypical lesions Colorectal cancer: 02/02/2017 Prostate cancer: N/A Lab Results  Component Value Date   PSA 0.9 03/08/2018   PSA 0.8 03/05/2017   PSA 0.82 03/01/2016     Lung cancer:   Low Dose CT Chest recommended if Age 23-80 years, 30 pack-year currently smoking OR have quit w/in 15years. Patient does not qualify.   ECG:  03/08/2018  Vaccines:   Shingrix: he refused it today  Flu: N/A educated and discussed with patient.  Advanced Care Planning: A voluntary discussion about advance care planning including the explanation and discussion of advance directives.  Discussed health care proxy and Living will, and the patient was able to identify a health care proxy as wife .    Patient Active Problem List   Diagnosis Date Noted  . Atherosclerosis of aorta (HCC) 08/26/2018  . Left renal atrophy 08/26/2018  . Hydronephrosis, left 03/08/2018  . Secondary hyperparathyroidism of renal origin (HCC) 03/08/2018  . Intermittent self-catheterization of bladder 03/08/2018  . History of epididymitis 03/08/2018  . Chronic kidney disease (CKD), stage III (moderate) (HCC) 03/06/2017  . Internal hemorrhoids without complication 03/05/2017  . Personal history of colonic polyps   . Benign prostatic hyperplasia with urinary obstruction 02/27/2015  . History of urinary retention 02/27/2015  . Vitamin D deficiency 08/10/2009  . Dyslipidemia 02/11/2007    Past Surgical  History:  Procedure Laterality Date  . COLONOSCOPY WITH PROPOFOL N/A 02/02/2017   Procedure: COLONOSCOPY WITH PROPOFOL;  Surgeon: Midge Minium, MD;  Location: [facility];  Service: Endoscopy;  Laterality: N/A;  . TONSILLECTOMY    . transurethral incision of bladder neck Bilateral 12/20/2008    Family History  Problem Relation Age of Onset  . Hypertension Mother   . Hypertension Father   . Prostate cancer Neg Hx   . Kidney cancer Neg Hx   . Bladder Cancer Neg Hx     Social History   Socioeconomic History  . Marital status: Married    Spouse name: Vance Gather  . Number of children: 3  . Years of education: Not on file  . Highest education level: Bachelor's degree (e.g., BA, AB, BS)  Occupational History  . Occupation: Furniture conservator/restorer   Tobacco Use  . Smoking status: Never Smoker  . Smokeless tobacco: Never Used  Vaping Use  . Vaping Use: Never used  Substance and Sexual Activity  . Alcohol use: No    Alcohol/week: 0.0 standard drinks  . Drug use: No  . Sexual activity: Yes    Partners: Female    Birth control/protection: None  Other Topics Concern  . Not on file  Social History Narrative   Patient stated that he worries about his boys (65, 65 & 57)   Social Determinants of Corporate investment banker Strain: Low Risk   . Difficulty of Paying Living Expenses: Not hard at all  Food Insecurity: No Food Insecurity  . Worried About Programme researcher, broadcasting/film/video in the Last Year: Never true  . Ran Out of Food in the Last Year: Never true  Transportation Needs: No Transportation Needs  . Lack of Transportation (Medical): No  . Lack of Transportation (Non-Medical): No  Physical Activity: Insufficiently Active  . Days of Exercise per Week: 3 days  . Minutes of Exercise per Session: 40 min  Stress: No Stress Concern Present  . Feeling of Stress : Not at all  Social Connections: Socially Integrated  . Frequency of Communication with Friends and Family: More than three times a  week  . Frequency of Social Gatherings with Friends and Family: Once a week  . Attends Religious Services: More than 4 times per year  . Active Member of Clubs or Organizations: Yes  . Attends Banker Meetings: More than 4 times per year  . Marital Status: Married  Catering manager Violence: Not At Risk  . Fear of Current or Ex-Partner: No  . Emotionally Abused: No  . Physically Abused: No  . Sexually Abused: No     Current Outpatient Medications:  .  Catheters (BARD COUDE TIP CATHETER) MISC, Use 4 times daily as needed (Patient not taking: Reported on 03/16/2020), Disp: 300 each, Rfl: 12 .  Cholecalciferol (VITAMIN D) 2000 UNITS tablet, Take by mouth., Disp: , Rfl:  .  Cyanocobalamin (VITAMIN B-12 PO), Take by mouth., Disp: , Rfl:  .  finasteride (PROSCAR) 5 MG tablet, Take 1 tablet (5 mg total) by mouth daily., Disp: 90 tablet, Rfl: 2 .  MAGNESIUM OXIDE PO, Take by mouth., Disp: , Rfl:  .  Omega-3 Fatty Acids (FISH OIL) 1000 MG CAPS, Take 1 capsule (1,000 mg total) by mouth 2 (two) times daily., Disp: 60 capsule, Rfl: 0 .  tamsulosin (FLOMAX) 0.4 MG CAPS capsule, Take 1 capsule (0.4 mg total) by mouth daily., Disp: 90 capsule, Rfl: 3  No Known Allergies   ROS  Constitutional: Negative for fever or weight change.  Respiratory: Negative for cough and shortness of breath.  Cardiovascular: Negative for chest pain or palpitations.  Gastrointestinal: Negative for abdominal pain, no bowel changes.  Musculoskeletal: Negative for gait problem or joint swelling.  Skin: Negative for rash.  Neurological: Negative for dizziness or headache.  No other specific complaints in a complete review of systems (except as listed in HPI above).  Objective  Vitals:   09/16/20 0852  BP: (!) 158/90  Pulse: 74  Resp: 18  Temp: 97.8 F (36.6 C)  TempSrc: Oral  SpO2: 97%  Weight: 181 lb 6.4 oz (82.3 kg)  Height: 5\' 11"  (1.803 m)    Body mass index is 25.3 kg/m.  Physical  Exam  Constitutional: Patient appears well-developed and well-nourished. No distress.  HENT: Head: Normocephalic and atraumatic. Ears: B TMs ok, no erythema or effusion; Nose: Nose normal. Mouth/Throat: Oropharynx is clear and moist. No oropharyngeal exudate.  Eyes: Conjunctivae and EOM are normal. Pupils are equal, round, and reactive to light. No scleral icterus.  Neck: Normal range of motion. Neck supple. No JVD present. No thyromegaly present.  Cardiovascular: Normal rate, regular rhythm and normal heart sounds.  No murmur heard. No BLE edema. Pulmonary/Chest: Effort normal and breath sounds normal. No respiratory distress. Abdominal: Soft. Bowel sounds are normal, no distension. There is no tenderness. no masses MALE GENITALIA: refused RECTAL:refused Musculoskeletal: Normal range of motion, no joint effusions. No gross deformities Neurological: he is alert and oriented to person, place, and time. No cranial nerve deficit. Coordination, balance, strength, speech and gait are normal.  Skin: Skin is warm and dry. No rash noted. No erythema.  Psychiatric: Patient has a normal mood and affect. behavior is normal. Judgment and thought content normal.   Fall Risk: Fall Risk  09/16/2020 03/16/2020 09/16/2019 03/07/2019 08/26/2018  Falls in the past year? 0 0 0 0 0  Number falls in past yr: 0 0 0 0 -  Injury with Fall? 0 0 0 0 -  Follow up - Falls evaluation completed - - -      Functional Status Survey: Is the patient deaf or have difficulty hearing?: No Does the patient have difficulty seeing, even when wearing glasses/contacts?: No Does the patient have difficulty concentrating, remembering, or making decisions?: No Does the patient have difficulty walking or climbing stairs?: No Does the patient have difficulty dressing or bathing?: No Does the patient have difficulty doing errands alone such as visiting a doctor's office or shopping?: No   Assessment & Plan  1. Benign prostatic  hyperplasia with urinary obstruction  - finasteride (PROSCAR) 5 MG tablet; Take 1 tablet (5 mg total) by mouth daily.  Dispense: 90 tablet; Refill: 2 - PSA  2. Vitamin D deficiency  - VITAMIN D 25 Hydroxy (Vit-D Deficiency, Fractures)  3. Atherosclerosis of aorta (HCC)  Refuses statin therapy   4. Need for hepatitis C screening test  - Hepatitis C Antibody  5. Dyslipidemia  - Lipid panel  6. Diabetes mellitus screening  - Hemoglobin A1c  7. Well adult exam  - Hepatic function panel  8. Left renal atrophy   9. Stage 3a chronic kidney disease (HCC)   10. Vegan diet  - B12 and Folate Panel  -Prostate cancer screening and PSA options (with potential risks and benefits of testing vs not testing) were discussed along with recent recs/guidelines. -USPSTF grade A and B recommendations reviewed with patient; age-appropriate recommendations, preventive care, screening tests, etc discussed and encouraged; healthy living encouraged; see AVS for patient education given to patient -Discussed importance of 150 minutes of  physical activity weekly, eat two servings of fish weekly, eat one serving of tree nuts ( cashews, pistachios, pecans, almonds.Marland Kitchen) every other day, eat 6 servings of fruit/vegetables daily and drink plenty of water and avoid sweet beverages.

## 2020-09-16 ENCOUNTER — Ambulatory Visit (INDEPENDENT_AMBULATORY_CARE_PROVIDER_SITE_OTHER): Payer: Managed Care, Other (non HMO) | Admitting: Family Medicine

## 2020-09-16 ENCOUNTER — Other Ambulatory Visit: Payer: Self-pay

## 2020-09-16 ENCOUNTER — Encounter: Payer: Self-pay | Admitting: Family Medicine

## 2020-09-16 VITALS — BP 158/90 | HR 74 | Temp 97.8°F | Resp 18 | Ht 71.0 in | Wt 181.4 lb

## 2020-09-16 DIAGNOSIS — N261 Atrophy of kidney (terminal): Secondary | ICD-10-CM

## 2020-09-16 DIAGNOSIS — Z1159 Encounter for screening for other viral diseases: Secondary | ICD-10-CM | POA: Diagnosis not present

## 2020-09-16 DIAGNOSIS — I700 Atherosclerosis of aorta: Secondary | ICD-10-CM | POA: Diagnosis not present

## 2020-09-16 DIAGNOSIS — Z131 Encounter for screening for diabetes mellitus: Secondary | ICD-10-CM

## 2020-09-16 DIAGNOSIS — E785 Hyperlipidemia, unspecified: Secondary | ICD-10-CM

## 2020-09-16 DIAGNOSIS — E559 Vitamin D deficiency, unspecified: Secondary | ICD-10-CM | POA: Diagnosis not present

## 2020-09-16 DIAGNOSIS — Z0000 Encounter for general adult medical examination without abnormal findings: Secondary | ICD-10-CM | POA: Diagnosis not present

## 2020-09-16 DIAGNOSIS — N138 Other obstructive and reflux uropathy: Secondary | ICD-10-CM

## 2020-09-16 DIAGNOSIS — Z789 Other specified health status: Secondary | ICD-10-CM

## 2020-09-16 DIAGNOSIS — N401 Enlarged prostate with lower urinary tract symptoms: Secondary | ICD-10-CM

## 2020-09-16 DIAGNOSIS — N1831 Chronic kidney disease, stage 3a: Secondary | ICD-10-CM

## 2020-09-16 MED ORDER — FINASTERIDE 5 MG PO TABS: 5.0000 mg | ORAL_TABLET | Freq: Every day | ORAL | 2 refills | Status: DC

## 2020-09-16 NOTE — Patient Instructions (Signed)
Preventive Care 41-56 Years Old, Male Preventive care refers to lifestyle choices and visits with your health care provider that can promote health and wellness. This includes:  A yearly physical exam. This is also called an annual well check.  Regular dental and eye exams.  Immunizations.  Screening for certain conditions.  Healthy lifestyle choices, such as eating a healthy diet, getting regular exercise, not using drugs or products that contain nicotine and tobacco, and limiting alcohol use. What can I expect for my preventive care visit? Physical exam Your health care provider will check:  Height and weight. These may be used to calculate body mass index (BMI), which is a measurement that tells if you are at a healthy weight.  Heart rate and blood pressure.  Your skin for abnormal spots. Counseling Your health care provider may ask you questions about:  Alcohol, tobacco, and drug use.  Emotional well-being.  Home and relationship well-being.  Sexual activity.  Eating habits.  Work and work Statistician. What immunizations do I need?  Influenza (flu) vaccine  This is recommended every year. Tetanus, diphtheria, and pertussis (Tdap) vaccine  You may need a Td booster every 10 years. Varicella (chickenpox) vaccine  You may need this vaccine if you have not already been vaccinated. Zoster (shingles) vaccine  You may need this after age 64. Measles, mumps, and rubella (MMR) vaccine  You may need at least one dose of MMR if you were born in 1957 or later. You may also need a second dose. Pneumococcal conjugate (PCV13) vaccine  You may need this if you have certain conditions and were not previously vaccinated. Pneumococcal polysaccharide (PPSV23) vaccine  You may need one or two doses if you smoke cigarettes or if you have certain conditions. Meningococcal conjugate (MenACWY) vaccine  You may need this if you have certain conditions. Hepatitis A  vaccine  You may need this if you have certain conditions or if you travel or work in places where you may be exposed to hepatitis A. Hepatitis B vaccine  You may need this if you have certain conditions or if you travel or work in places where you may be exposed to hepatitis B. Haemophilus influenzae type b (Hib) vaccine  You may need this if you have certain risk factors. Human papillomavirus (HPV) vaccine  If recommended by your health care provider, you may need three doses over 6 months. You may receive vaccines as individual doses or as more than one vaccine together in one shot (combination vaccines). Talk with your health care provider about the risks and benefits of combination vaccines. What tests do I need? Blood tests  Lipid and cholesterol levels. These may be checked every 5 years, or more frequently if you are over 60 years old.  Hepatitis C test.  Hepatitis B test. Screening  Lung cancer screening. You may have this screening every year starting at age 43 if you have a 30-pack-year history of smoking and currently smoke or have quit within the past 15 years.  Prostate cancer screening. Recommendations will vary depending on your family history and other risks.  Colorectal cancer screening. All adults should have this screening starting at age 72 and continuing until age 2. Your health care provider may recommend screening at age 14 if you are at increased risk. You will have tests every 1-10 years, depending on your results and the type of screening test.  Diabetes screening. This is done by checking your blood sugar (glucose) after you have not eaten  for a while (fasting). You may have this done every 1-3 years.  Sexually transmitted disease (STD) testing. Follow these instructions at home: Eating and drinking  Eat a diet that includes fresh fruits and vegetables, whole grains, lean protein, and low-fat dairy products.  Take vitamin and mineral supplements as  recommended by your health care provider.  Do not drink alcohol if your health care provider tells you not to drink.  If you drink alcohol: ? Limit how much you have to 0-2 drinks a day. ? Be aware of how much alcohol is in your drink. In the [place] one drink equals one 12 oz bottle of beer (355 mL), one 5 oz glass of wine (148 mL), or one 1 oz glass of hard liquor (44 mL). Lifestyle  Take daily care of your teeth and gums.  Stay active. Exercise for at least 30 minutes on 5 or more days each week.  Do not use any products that contain nicotine or tobacco, such as cigarettes, e-cigarettes, and chewing tobacco. If you need help quitting, ask your health care provider.  If you are sexually active, practice safe sex. Use a condom or other form of protection to prevent STIs (sexually transmitted infections).  Talk with your health care provider about taking a low-dose aspirin every day starting at age 53. What's next?  Go to your health care provider once a year for a well check visit.  Ask your health care provider how often you should have your eyes and teeth checked.  Stay up to date on all vaccines. This information is not intended to replace advice given to you by your health care provider. Make sure you discuss any questions you have with your health care provider. Document Revised: 08/22/2018 Document Reviewed: 08/22/2018 Elsevier Patient Education  2020 [employer].

## 2020-09-17 LAB — HEPATIC FUNCTION PANEL
AG Ratio: 1.6 (calc) (ref 1.0–2.5)
ALT: 11 U/L (ref 9–46)
AST: 15 U/L (ref 10–35)
Albumin: 4.5 g/dL (ref 3.6–5.1)
Alkaline phosphatase (APISO): 69 U/L (ref 35–144)
Bilirubin, Direct: 0.2 mg/dL (ref 0.0–0.2)
Globulin: 2.8 g/dL (calc) (ref 1.9–3.7)
Indirect Bilirubin: 0.9 mg/dL (calc) (ref 0.2–1.2)
Total Bilirubin: 1.1 mg/dL (ref 0.2–1.2)
Total Protein: 7.3 g/dL (ref 6.1–8.1)

## 2020-09-17 LAB — LIPID PANEL
Cholesterol: 213 mg/dL — ABNORMAL HIGH (ref <200)
HDL: 55 mg/dL (ref >=40)
LDL Cholesterol (Calc): 139 mg/dL (calc) — ABNORMAL HIGH
Non-HDL Cholesterol (Calc): 158 mg/dL (calc) — ABNORMAL HIGH (ref <130)
Total CHOL/HDL Ratio: 3.9 (calc) (ref <5.0)
Triglycerides: 91 mg/dL (ref <150)

## 2020-09-17 LAB — HEMOGLOBIN A1C
Hgb A1c MFr Bld: 5.1 % of total Hgb (ref <5.7)
Mean Plasma Glucose: 100 mg/dL
eAG (mmol/L): 5.5 mmol/L

## 2020-09-17 LAB — PSA: PSA: 0.32 ng/mL (ref <=4.0)

## 2020-09-17 LAB — HEPATITIS C ANTIBODY
Hepatitis C Ab: NONREACTIVE
SIGNAL TO CUT-OFF: 0.02 (ref <1.00)

## 2020-09-17 LAB — VITAMIN D 25 HYDROXY (VIT D DEFICIENCY, FRACTURES): Vit D, 25-Hydroxy: 79 ng/mL (ref 30–100)

## 2020-09-17 LAB — B12 AND FOLATE PANEL
Folate: 15 ng/mL
Vitamin B-12: 1622 pg/mL — ABNORMAL HIGH (ref 200–1100)

## 2020-12-09 ENCOUNTER — Encounter: Payer: Managed Care, Other (non HMO) | Admitting: Family Medicine

## 2021-03-16 ENCOUNTER — Encounter: Payer: Managed Care, Other (non HMO) | Admitting: Family Medicine

## 2021-04-13 NOTE — Progress Notes (Signed)
Name: Mitchell Doyle   MRN: [id number]    DOB: 1965-09-24   Date:04/14/2021       Progress Note  Subjective  Chief Complaint  Chief Complaint  Patient presents with   Annual Exam    HPI  CKI: he had a GFR of 33 June 2018 with elevation of potassium level. His CKI was secondary to left kidney hydronephrosis because of urinary retention, still sees nephrologist Dr. Cherylann Ratel but no longer seeing an Urologist.Reviewed labs done 01/2021, GFR was stable on stage III    Atherosclerosis of aorta: discussed CT scan done, he has hyperlipidemia, he had repeat labs done by Dr. Cherylann Ratel in May and LDL was 140, he is taking aspirin now but not on statin therapy yet, explained ASCVD not very high, but since he has atherosclerosis of aorta still a candidate for statin therapy . He has changed his diet again, exercising more and would like to repeat lipid panel today    The 10-year ASCVD risk score Denman George DC Montez Hageman., et al., 2013) is: 7.7%   Values used to calculate the score:     Age: 55 years     Sex: Male     Is Non-Hispanic African American: Yes     Diabetic: No     Tobacco smoker: No     Systolic Blood Pressure: 142 mmHg     Is BP treated: No     HDL Cholesterol: 63 mg/dL     Total Cholesterol: 223 mg/dL    BPH: history of TURP in 2016 at [facility] , he has been doing self calth 3 times daily and states he does not mind doing it. One episode of UTI in 2022 treated at Urgent Care. He wants to find another Insurance underwriter, one he can trust ,does not want to go to [facility], discussed going back to [facility], he wants to hold off for now   Vitamin D deficiency: taking otc supplementation   White coat syndrome: his bp has been at goal at home, range from 100-140/66-90, but mostly 120/70's at home. Advised to bring his bp machine to calibrate on hours   Patient Active Problem List   Diagnosis Date Noted   Atherosclerosis of aorta (HCC) 08/26/2018   Left renal atrophy 08/26/2018   Hydronephrosis, left 03/08/2018    Secondary hyperparathyroidism of renal origin (HCC) 03/08/2018   Intermittent self-catheterization of bladder 03/08/2018   History of epididymitis 03/08/2018   Chronic kidney disease (CKD), stage III (moderate) (HCC) 03/06/2017   Internal hemorrhoids without complication 03/05/2017   Personal history of colonic polyps    Benign prostatic hyperplasia with urinary obstruction 02/27/2015   History of urinary retention 02/27/2015   Vitamin D deficiency 08/10/2009   Dyslipidemia 02/11/2007    Past Surgical History:  Procedure Laterality Date   COLONOSCOPY WITH PROPOFOL N/A 02/02/2017   Procedure: COLONOSCOPY WITH PROPOFOL;  Surgeon: Midge Minium, MD;  Location: [facility];  Service: Endoscopy;  Laterality: N/A;   TONSILLECTOMY     transurethral incision of bladder neck Bilateral 12/20/2008    Family History  Problem Relation Age of Onset   Hypertension Mother    Hypertension Father    Prostate cancer Neg Hx    Kidney cancer Neg Hx    Bladder Cancer Neg Hx     Social History   Tobacco Use   Smoking status: Never   Smokeless tobacco: Never  Substance Use Topics   Alcohol use: No    Alcohol/week: 0.0 standard drinks  Current Outpatient Medications:    Cholecalciferol (VITAMIN D) 2000 UNITS tablet, Take by mouth., Disp: , Rfl:    Cyanocobalamin (VITAMIN B-12 PO), Take by mouth., Disp: , Rfl:    finasteride (PROSCAR) 5 MG tablet, Take 1 tablet (5 mg total) by mouth daily., Disp: 90 tablet, Rfl: 2   MAGNESIUM OXIDE PO, Take by mouth., Disp: , Rfl:    Omega-3 Fatty Acids (FISH OIL) 1000 MG CAPS, Take 1 capsule (1,000 mg total) by mouth 2 (two) times daily., Disp: 60 capsule, Rfl: 0   Probiotic Product (PROBIOTIC-10 PO), Take by mouth., Disp: , Rfl:    tamsulosin (FLOMAX) 0.4 MG CAPS capsule, Take 1 capsule (0.4 mg total) by mouth daily., Disp: 90 capsule, Rfl: 3   vitamin C (ASCORBIC ACID) 500 MG tablet, Take 500 mg by mouth daily., Disp: , Rfl:    Zinc 50 MG CAPS,  Take 1 capsule by mouth daily before breakfast., Disp: , Rfl:   No Known Allergies  I personally reviewed active problem list, medication list, allergies, family history, social history, health maintenance with the patient/caregiver today.   ROS  Constitutional: Negative for fever or weight change.  Respiratory: Negative for cough and shortness of breath.   Cardiovascular: Negative for chest pain or palpitations.  Gastrointestinal: Negative for abdominal pain, no bowel changes.  Musculoskeletal: Negative for gait problem or joint swelling.  Skin: Negative for rash.  Neurological: Negative for dizziness or headache.  No other specific complaints in a complete review of systems (except as listed in HPI above).   Objective  Vitals:   04/14/21 0846 04/14/21 0857  BP: (!) 152/98 (!) 142/90  Pulse: 94   Resp: 16   Temp: 98.4 F (36.9 C)   SpO2: 97%   Weight: 176 lb (79.8 kg)   Height: 5\' 11"  (1.803 m)     Body mass index is 24.55 kg/m.  Physical Exam  Constitutional: Patient appears well-developed and well-nourished.  No distress.  HEENT: head atraumatic, normocephalic, pupils equal and reactive to light, neck supple Cardiovascular: Normal rate, regular rhythm and normal heart sounds.  No murmur heard. No BLE edema. Rectal: normal tonus, enlarged prostate but no masses, normal consistency, mild maceration on perineum  Pulmonary/Chest: Effort normal and breath sounds normal. No respiratory distress. Abdominal: Soft.  There is no tenderness. Psychiatric: Patient has a normal mood and affect. behavior is normal. Judgment and thought content normal.   PHQ2/9: Depression screen [facility] 2/9 04/14/2021 09/16/2020 03/16/2020 09/16/2019 03/07/2019  Decreased Interest 0 0 0 0 0  Down, Depressed, Hopeless 0 0 0 0 0  PHQ - 2 Score 0 0 0 0 0  Altered sleeping - 0 0 0 0  Tired, decreased energy - 0 0 0 0  Change in appetite - 0 0 0 0  Feeling bad or failure about yourself  - 0 0 0 0  Trouble  concentrating - 0 0 0 0  Moving slowly or fidgety/restless - 0 0 0 0  Suicidal thoughts - 0 0 0 0  PHQ-9 Score - 0 0 0 0  Difficult doing work/chores - - Not difficult at all - -    phq 9 is negative   Fall Risk: Fall Risk  04/14/2021 09/16/2020 03/16/2020 09/16/2019 03/07/2019  Falls in the past year? 0 0 0 0 0  Number falls in past yr: 0 0 0 0 0  Injury with Fall? 0 0 0 0 0  Follow up - - Falls evaluation completed - -  Functional Status Survey: Is the patient deaf or have difficulty hearing?: No Does the patient have difficulty seeing, even when wearing glasses/contacts?: No Does the patient have difficulty concentrating, remembering, or making decisions?: No Does the patient have difficulty walking or climbing stairs?: No Does the patient have difficulty dressing or bathing?: No Does the patient have difficulty doing errands alone such as visiting a doctor's office or shopping?: No    Assessment & Plan  1. Atherosclerosis of aorta (HCC)  - Lipid panel  2. Stage 3a chronic kidney disease (HCC)   3. Benign prostatic hyperplasia with urinary obstruction  - finasteride (PROSCAR) 5 MG tablet; Take 1 tablet (5 mg total) by mouth daily.  Dispense: 90 tablet; Refill: 3 - tamsulosin (FLOMAX) 0.4 MG CAPS capsule; Take 1 capsule (0.4 mg total) by mouth daily.  Dispense: 90 capsule; Refill: 3  4. Need for shingles vaccine  Refused   5. Left renal atrophy   6. Dyslipidemia  - Lipid panel  7. Intermittent self-catheterization of bladder

## 2021-04-13 NOTE — Patient Instructions (Signed)
Preventive Care 40-56 Years Old, Male Preventive care refers to lifestyle choices and visits with your health care provider that can promote health and wellness. This includes: A yearly physical exam. This is also called an annual wellness visit. Regular dental and eye exams. Immunizations. Screening for certain conditions. Healthy lifestyle choices, such as: Eating a healthy diet. Getting regular exercise. Not using drugs or products that contain nicotine and tobacco. Limiting alcohol use. What can I expect for my preventive care visit? Physical exam Your health care provider will check your: Height and weight. These may be used to calculate your BMI (body mass index). BMI is a measurement that tells if you are at a healthy weight. Heart rate and blood pressure. Body temperature. Skin for abnormal spots. Counseling Your health care provider may ask you questions about your: Past medical problems. Family's medical history. Alcohol, tobacco, and drug use. Emotional well-being. Home life and relationship well-being. Sexual activity. Diet, exercise, and sleep habits. Work and work environment. Access to firearms. What immunizations do I need?  Vaccines are usually given at various ages, according to a schedule. Your health care provider will recommend vaccines for you based on your age, medicalhistory, and lifestyle or other factors, such as travel or where you work. What tests do I need? Blood tests Lipid and cholesterol levels. These may be checked every 5 years, or more often if you are over 50 years old. Hepatitis C test. Hepatitis B test. Screening Lung cancer screening. You may have this screening every year starting at age 55 if you have a 30-pack-year history of smoking and currently smoke or have quit within the past 15 years. Prostate cancer screening. Recommendations will vary depending on your family history and other risks. Genital exam to check for testicular cancer  or hernias. Colorectal cancer screening. All adults should have this screening starting at age 50 and continuing until age 75. Your health care provider may recommend screening at age 45 if you are at increased risk. You will have tests every 1-10 years, depending on your results and the type of screening test. Diabetes screening. This is done by checking your blood sugar (glucose) after you have not eaten for a while (fasting). You may have this done every 1-3 years. STD (sexually transmitted disease) testing, if you are at risk. Follow these instructions at home: Eating and drinking  Eat a diet that includes fresh fruits and vegetables, whole grains, lean protein, and low-fat dairy products. Take vitamin and mineral supplements as recommended by your health care provider. Do not drink alcohol if your health care provider tells you not to drink. If you drink alcohol: Limit how much you have to 0-2 drinks a day. Be aware of how much alcohol is in your drink. In the [place] one drink equals one 12 oz bottle of beer (355 mL), one 5 oz glass of wine (148 mL), or one 1 oz glass of hard liquor (44 mL).  Lifestyle Take daily care of your teeth and gums. Brush your teeth every morning and night with fluoride toothpaste. Floss one time each day. Stay active. Exercise for at least 30 minutes 5 or more days each week. Do not use any products that contain nicotine or tobacco, such as cigarettes, e-cigarettes, and chewing tobacco. If you need help quitting, ask your health care provider. Do not use drugs. If you are sexually active, practice safe sex. Use a condom or other form of protection to prevent STIs (sexually transmitted infections). If told by   your health care provider, take low-dose aspirin daily starting at age 50. Find healthy ways to cope with stress, such as: Meditation, yoga, or listening to music. Journaling. Talking to a trusted person. Spending time with friends and  family. Safety Always wear your seat belt while driving or riding in a vehicle. Do not drive: If you have been drinking alcohol. Do not ride with someone who has been drinking. When you are tired or distracted. While texting. Wear a helmet and other protective equipment during sports activities. If you have firearms in your house, make sure you follow all gun safety procedures. What's next? Go to your health care provider once a year for an annual wellness visit. Ask your health care provider how often you should have your eyes and teeth checked. Stay up to date on all vaccines. This information is not intended to replace advice given to you by your health care provider. Make sure you discuss any questions you have with your healthcare provider. Document Revised: 05/27/2019 Document Reviewed: 08/22/2018 Elsevier Patient Education  2022 [employer]  

## 2021-04-14 ENCOUNTER — Ambulatory Visit (INDEPENDENT_AMBULATORY_CARE_PROVIDER_SITE_OTHER): Payer: Managed Care, Other (non HMO) | Admitting: Family Medicine

## 2021-04-14 ENCOUNTER — Other Ambulatory Visit: Payer: Self-pay

## 2021-04-14 ENCOUNTER — Encounter: Payer: Self-pay | Admitting: Family Medicine

## 2021-04-14 VITALS — BP 142/90 | HR 94 | Temp 98.4°F | Resp 16 | Ht 71.0 in | Wt 176.0 lb

## 2021-04-14 DIAGNOSIS — Z0000 Encounter for general adult medical examination without abnormal findings: Secondary | ICD-10-CM

## 2021-04-14 DIAGNOSIS — Z23 Encounter for immunization: Secondary | ICD-10-CM

## 2021-04-14 DIAGNOSIS — N138 Other obstructive and reflux uropathy: Secondary | ICD-10-CM

## 2021-04-14 DIAGNOSIS — N1831 Chronic kidney disease, stage 3a: Secondary | ICD-10-CM

## 2021-04-14 DIAGNOSIS — E785 Hyperlipidemia, unspecified: Secondary | ICD-10-CM

## 2021-04-14 DIAGNOSIS — N261 Atrophy of kidney (terminal): Secondary | ICD-10-CM

## 2021-04-14 DIAGNOSIS — Z789 Other specified health status: Secondary | ICD-10-CM

## 2021-04-14 DIAGNOSIS — I700 Atherosclerosis of aorta: Secondary | ICD-10-CM | POA: Diagnosis not present

## 2021-04-14 DIAGNOSIS — N401 Enlarged prostate with lower urinary tract symptoms: Secondary | ICD-10-CM | POA: Diagnosis not present

## 2021-04-14 MED ORDER — TAMSULOSIN HCL 0.4 MG PO CAPS: 0.4000 mg | ORAL_CAPSULE | Freq: Every day | ORAL | 3 refills | Status: DC

## 2021-04-14 MED ORDER — FINASTERIDE 5 MG PO TABS: 5.0000 mg | ORAL_TABLET | Freq: Every day | ORAL | 3 refills | Status: DC

## 2021-04-15 LAB — LIPID PANEL
Cholesterol: 223 mg/dL — ABNORMAL HIGH (ref <200)
HDL: 73 mg/dL (ref >=40)
LDL Cholesterol (Calc): 132 mg/dL (calc) — ABNORMAL HIGH
Non-HDL Cholesterol (Calc): 150 mg/dL (calc) — ABNORMAL HIGH (ref <130)
Total CHOL/HDL Ratio: 3.1 (calc) (ref <5.0)
Triglycerides: 81 mg/dL (ref <150)

## 2021-09-16 ENCOUNTER — Ambulatory Visit: Payer: Managed Care, Other (non HMO) | Admitting: Family Medicine

## 2022-04-14 NOTE — Patient Instructions (Signed)

## 2022-04-14 NOTE — Progress Notes (Addendum)
Name: Mitchell Doyle   MRN: [id number]    DOB: [birth year]   Date:04/17/2022       Progress Note  Subjective  Chief Complaint  Annual Exam  HPI  Patient presents for annual CPE.  IPSS Questionnaire (AUA-7): Over the past month.   1)  How often have you had a sensation of not emptying your bladder completely after you finish urinating?  0 - Not at all  2)  How often have you had to urinate again less than two hours after you finished urinating? 0 - Not at all  3)  How often have you found you stopped and started again several times when you urinated?  0 - Not at all  4) How difficult have you found it to postpone urination?  0 - Not at all  5) How often have you had a weak urinary stream?  0 - Not at all  6) How often have you had to push or strain to begin urination?  0 - Not at all  7) How many times did you most typically get up to urinate from the time you went to bed until the time you got up in the morning?  1 - 1 time  Total score:  0-7 mildly symptomatic   8-19 moderately symptomatic   20-35 severely symptomatic     Diet: plant based diet  Exercise: continue regular physical activity   Depression: phq 9 is negative    04/17/2022    8:16 AM 04/14/2021    8:45 AM 09/16/2020    8:51 AM 03/16/2020    8:07 AM 09/16/2019    9:40 AM  Depression screen PHQ 2/9  Decreased Interest 0 0 0 0 0  Down, Depressed, Hopeless 0 0 0 0 0  PHQ - 2 Score 0 0 0 0 0  Altered sleeping 0  0 0 0  Tired, decreased energy 1  0 0 0  Change in appetite 0  0 0 0  Feeling bad or failure about yourself  0  0 0 0  Trouble concentrating 0  0 0 0  Moving slowly or fidgety/restless 0  0 0 0  Suicidal thoughts 0  0 0 0  PHQ-9 Score 1  0 0 0  Difficult doing work/chores Not difficult at all   Not difficult at all     Hypertension:  BP Readings from Last 3 Encounters:  04/17/22 118/78  04/14/21 (!) 142/90  09/16/20 (!) 158/90    Obesity: Wt Readings from Last 3 Encounters:  04/17/22 170 lb 11.2 oz (77.4  kg)  04/14/21 176 lb (79.8 kg)  09/16/20 181 lb 6.4 oz (82.3 kg)   BMI Readings from Last 3 Encounters:  04/17/22 23.81 kg/m  04/14/21 24.55 kg/m  09/16/20 25.30 kg/m     Lipids:  Lab Results  Component Value Date   CHOL 223 (H) 04/14/2021   CHOL 213 (H) 09/16/2020   CHOL 187 09/16/2019   Lab Results  Component Value Date   HDL 73 04/14/2021   HDL 55 09/16/2020   HDL 59 09/16/2019   Lab Results  Component Value Date   LDLCALC 132 (H) 04/14/2021   LDLCALC 139 (H) 09/16/2020   LDLCALC 108 (H) 09/16/2019   Lab Results  Component Value Date   TRIG 81 04/14/2021   TRIG 91 09/16/2020   TRIG 100 09/16/2019   Lab Results  Component Value Date   CHOLHDL 3.1 04/14/2021   CHOLHDL 3.9 09/16/2020   CHOLHDL 3.2  09/16/2019   No results found for: "LDLDIRECT" Glucose:  Glucose, Bld  Date Value Ref Range Status  08/26/2018 88 65 - 99 mg/dL Final    Comment:    .            Fasting reference interval .   03/08/2018 87 65 - 99 mg/dL Final    Comment:    .            Fasting reference interval .   03/05/2017 74 65 - 99 mg/dL Final    Flowsheet Row [facility] Visit from 09/16/[facility]  AUDIT-C Score 0       Married STD testing and prevention (HIV/chl/gon/syphilis):  he does not want to get HIV test done  Sexual history: one partner, married, no problems  Hep C Screening: 09/16/20 Skin cancer: Discussed monitoring for atypical lesions Colorectal cancer: 02/02/17, he is due for repeat colonoscopy  Prostate cancer:    Lab Results  Component Value Date   PSA 0.32 09/16/2020   PSA 0.9 03/08/2018   PSA 0.8 03/05/2017     Lung cancer:  Low Dose CT Chest recommended if Age 80-80 years, 30 pack-year currently smoking OR have quit w/in 15years. Patient  no a candidate for screening    ECG:  03/08/18  Vaccines:   HPV: N/A Tdap: up to date Shingrix:  not interested  Pneumonia: N/A Flu: 2015, refuses flu vaccine COVID-19: bivalent  booster discussed with patient   Advanced Care Planning: A voluntary discussion about advance care planning including the explanation and discussion of advance directives.  Discussed health care proxy and Living will, and the patient was able to identify a health care proxy as wife.  Patient has a living will and power of attorney of health care   Patient Active Problem List   Diagnosis Date Noted   Atherosclerosis of aorta ([place]) 08/26/2018   Left renal atrophy 08/26/2018   Hydronephrosis, left 03/08/2018   Secondary hyperparathyroidism of renal origin ([place]) 03/08/2018   Intermittent self-catheterization of bladder 03/08/2018   History of epididymitis 03/08/2018   Chronic kidney disease (CKD), stage III (moderate) ([place]) 03/06/2017   Internal hemorrhoids without complication 0000000   Personal history of colonic polyps    Benign prostatic hyperplasia with urinary obstruction 02/27/2015   History of urinary retention 02/27/2015   Vitamin D deficiency 08/10/2009   Dyslipidemia 02/11/2007    Past Surgical History:  Procedure Laterality Date   COLONOSCOPY WITH PROPOFOL N/A 02/02/2017   Procedure: COLONOSCOPY WITH PROPOFOL;  Surgeon: Lucilla Lame, MD;  Location: [facility];  Service: Endoscopy;  Laterality: N/A;   TONSILLECTOMY     transurethral incision of bladder neck Bilateral 12/20/2008    Family History  Problem Relation Age of Onset   Hypertension Mother    Hypertension Father    Prostate cancer Neg Hx    Kidney cancer Neg Hx    Bladder Cancer Neg Hx     Social History   Socioeconomic History   Marital status: Married    Spouse name: Lollie Marrow   Number of children: 3   Years of education: Not on file   Highest education level: Bachelor's degree (e.g., BA, AB, BS)  Occupational History   Occupation: Government social research officer   Tobacco Use   Smoking status: Never   Smokeless tobacco: Never  Vaping Use   Vaping Use: Never used  Substance and Sexual Activity   Alcohol  use: No    Alcohol/week: 0.0 standard drinks of  alcohol   Drug use: No   Sexual activity: Yes    Partners: Female    Birth control/protection: None  Other Topics Concern   Not on file  Social History Narrative   Patient stated that he worries about his boys (63, 24 & 25)   Social Determinants of Corporate investment banker Strain: Low Risk  (04/17/2022)   Overall Financial Resource Strain (CARDIA)    Difficulty of Paying Living Expenses: Not hard at all  Food Insecurity: No Food Insecurity (04/17/2022)   Hunger Vital Sign    Worried About Running Out of Food in the Last Year: Never true    Ran Out of Food in the Last Year: Never true  Transportation Needs: No Transportation Needs (04/17/2022)   PRAPARE - Administrator, Civil Service (Medical): No    Lack of Transportation (Non-Medical): No  Physical Activity: Insufficiently Active (04/17/2022)   Exercise Vital Sign    Days of Exercise per Week: 3 days    Minutes of Exercise per Session: 30 min  Stress: No Stress Concern Present (04/17/2022)   [employer] of Occupational Health - Occupational Stress Questionnaire    Feeling of Stress : Not at all  Social Connections: Moderately Isolated (04/17/2022)   Social Connection and Isolation Panel [NHANES]    Frequency of Communication with Friends and Family: Three times a week    Frequency of Social Gatherings with Friends and Family: Once a week    Attends Religious Services: Never    Database administrator or Organizations: No    Attends Banker Meetings: Never    Marital Status: Married  Catering manager Violence: Not At Risk (04/17/2022)   Humiliation, Afraid, Rape, and Kick questionnaire    Fear of Current or Ex-Partner: No    Emotionally Abused: No    Physically Abused: No    Sexually Abused: No     Current Outpatient Medications:    Cholecalciferol (VITAMIN D) 2000 UNITS tablet, Take by mouth., Disp: , Rfl:    Cyanocobalamin (VITAMIN B-12 PO), Take by  mouth., Disp: , Rfl:    MAGNESIUM OXIDE PO, Take by mouth., Disp: , Rfl:    Omega-3 Fatty Acids (FISH OIL) 1000 MG CAPS, Take 1 capsule (1,000 mg total) by mouth 2 (two) times daily., Disp: 60 capsule, Rfl: 0   Probiotic Product (PROBIOTIC-10 PO), Take by mouth., Disp: , Rfl:    vitamin C (ASCORBIC ACID) 500 MG tablet, Take 500 mg by mouth daily., Disp: , Rfl:    Zinc 50 MG CAPS, Take 1 capsule by mouth daily before breakfast., Disp: , Rfl:    finasteride (PROSCAR) 5 MG tablet, Take 1 tablet (5 mg total) by mouth daily., Disp: 90 tablet, Rfl: 3   tamsulosin (FLOMAX) 0.4 MG CAPS capsule, Take 1 capsule (0.4 mg total) by mouth daily., Disp: 90 capsule, Rfl: 3  No Known Allergies   ROS  Constitutional: Negative for fever , positive for  weight change.  Respiratory: Negative for cough and shortness of breath.   Cardiovascular: Negative for chest pain or palpitations.  Gastrointestinal: Negative for abdominal pain, no bowel changes.  Musculoskeletal: Negative for gait problem or joint swelling.  Skin: Negative for rash.  Neurological: Negative for dizziness or headache.  No other specific complaints in a complete review of systems (except as listed in HPI above).    Objective  Vitals:   04/17/22 0806  BP: 118/78  Pulse: 92  Resp: 16  Temp:  98.6 F (37 C)  TempSrc: Oral  SpO2: 100%  Weight: 170 lb 11.2 oz (77.4 kg)  Height: 5\' 11"  (1.803 m)    Body mass index is 23.81 kg/m.  Physical Exam  Constitutional: Patient appears well-developed and well-nourished. No distress.  HENT: Head: Normocephalic and atraumatic. Ears: B TMs ok, no erythema or effusion; Nose: Nose normal. Mouth/Throat: Oropharynx is clear and moist. No oropharyngeal exudate.  Eyes: Conjunctivae and EOM are normal. Pupils are equal, round, and reactive to light. No scleral icterus.  Neck: Normal range of motion. Neck supple. No JVD present. No thyromegaly present.  Cardiovascular: Normal rate, regular rhythm and  normal heart sounds.  No murmur heard. No BLE edema. Pulmonary/Chest: Effort normal and breath sounds normal. No respiratory distress. Abdominal: Soft. Bowel sounds are normal, no distension. There is no tenderness. no masses MALE GENITALIA: Normal descended testes bilaterally, no masses palpated, no hernias, no lesions, no discharge RECTAL: Prostate normal size and consistency, no rectal masses or hemorrhoids Musculoskeletal: Normal range of motion, no joint effusions. No gross deformities Neurological: he is alert and oriented to person, place, and time. No cranial nerve deficit. Coordination, balance, strength, speech and gait are normal.  Skin: Skin is warm and dry. No rash noted. No erythema.  Psychiatric: Patient has a normal mood and affect. behavior is normal. Judgment and thought content normal.   Fall Risk:    04/17/2022    8:15 AM 04/14/2021    8:45 AM 09/16/2020    8:51 AM 03/16/2020    8:07 AM 09/16/2019    9:39 AM  Fall Risk   Falls in the past year? 0 0 0 0 0  Number falls in past yr:  0 0 0 0  Injury with Fall?  0 0 0 0  Risk for fall due to : No Fall Risks      Follow up Falls prevention discussed;Education provided;Falls evaluation completed   Falls evaluation completed      Functional Status Survey: Is the patient deaf or have difficulty hearing?: No Does the patient have difficulty seeing, even when wearing glasses/contacts?: No Does the patient have difficulty concentrating, remembering, or making decisions?: No Does the patient have difficulty walking or climbing stairs?: No Does the patient have difficulty dressing or bathing?: No Does the patient have difficulty doing errands alone such as visiting a doctor's office or shopping?: No    Assessment & Plan  1. Well adult exam  - Ambulatory referral to Gastroenterology - Lipid panel - PSA - COMPLETE METABOLIC PANEL WITH GFR  2. Colon cancer screening  - Ambulatory referral to Gastroenterology  3. Benign  prostatic hyperplasia with urinary obstruction  - tamsulosin (FLOMAX) 0.4 MG CAPS capsule; Take 1 capsule (0.4 mg total) by mouth daily.  Dispense: 90 capsule; Refill: 3 - finasteride (PROSCAR) 5 MG tablet; Take 1 tablet (5 mg total) by mouth daily.  Dispense: 90 tablet; Refill: 3 - PSA  4. Atherosclerosis of aorta ([facility])  - Lipid panel Refuses statin therapy   5. Secondary hyperparathyroidism of renal origin ([place])  - COMPLETE METABOLIC PANEL WITH GFR   Discussed SGL-2 agonist and he refused, he also does not want to discuss medical problems today so he does not get billed a copay     -Prostate cancer screening and PSA options (with potential risks and benefits of testing vs not testing) were discussed along with recent recs/guidelines. -USPSTF grade A and B recommendations reviewed with patient; age-appropriate recommendations, preventive care, screening tests, etc  discussed and encouraged; healthy living encouraged; see AVS for patient education given to patient -Discussed importance of 150 minutes of physical activity weekly, eat two servings of fish weekly, eat one serving of tree nuts ( cashews, pistachios, pecans, almonds.Marland Kitchen) every other day, eat 6 servings of fruit/vegetables daily and drink plenty of water and avoid sweet beverages.  -Reviewed Health Maintenance: yes

## 2022-04-17 ENCOUNTER — Ambulatory Visit (INDEPENDENT_AMBULATORY_CARE_PROVIDER_SITE_OTHER): Payer: Managed Care, Other (non HMO) | Admitting: Family Medicine

## 2022-04-17 ENCOUNTER — Encounter: Payer: Self-pay | Admitting: Family Medicine

## 2022-04-17 VITALS — BP 118/78 | HR 92 | Temp 98.6°F | Resp 16 | Ht 71.0 in | Wt 170.7 lb

## 2022-04-17 DIAGNOSIS — N2581 Secondary hyperparathyroidism of renal origin: Secondary | ICD-10-CM

## 2022-04-17 DIAGNOSIS — Z0000 Encounter for general adult medical examination without abnormal findings: Secondary | ICD-10-CM

## 2022-04-17 DIAGNOSIS — N401 Enlarged prostate with lower urinary tract symptoms: Secondary | ICD-10-CM | POA: Diagnosis not present

## 2022-04-17 DIAGNOSIS — N138 Other obstructive and reflux uropathy: Secondary | ICD-10-CM

## 2022-04-17 DIAGNOSIS — Z1211 Encounter for screening for malignant neoplasm of colon: Secondary | ICD-10-CM | POA: Diagnosis not present

## 2022-04-17 DIAGNOSIS — I700 Atherosclerosis of aorta: Secondary | ICD-10-CM | POA: Diagnosis not present

## 2022-04-17 MED ORDER — TAMSULOSIN HCL 0.4 MG PO CAPS: 0.4000 mg | ORAL_CAPSULE | Freq: Every day | ORAL | 3 refills | Status: DC

## 2022-04-17 MED ORDER — FINASTERIDE 5 MG PO TABS
5.0000 mg | ORAL_TABLET | Freq: Every day | ORAL | 3 refills | Status: DC
Start: 2022-04-17 — End: 2023-02-26

## 2022-04-18 ENCOUNTER — Other Ambulatory Visit: Payer: Self-pay

## 2022-04-18 ENCOUNTER — Telehealth: Payer: Self-pay

## 2022-04-18 DIAGNOSIS — Z86010 Personal history of colonic polyps: Secondary | ICD-10-CM

## 2022-04-18 LAB — COMPLETE METABOLIC PANEL WITH GFR
AG Ratio: 1.3 (calc) (ref 1.0–2.5)
ALT: 13 U/L (ref 9–46)
AST: 15 U/L (ref 10–35)
Albumin: 4.2 g/dL (ref 3.6–5.1)
Alkaline phosphatase (APISO): 77 U/L (ref 35–144)
BUN/Creatinine Ratio: 6 (calc) (ref 6–22)
BUN: 14 mg/dL (ref 7–25)
CO2: 26 mmol/L (ref 20–32)
Calcium: 9.8 mg/dL (ref 8.6–10.3)
Chloride: 100 mmol/L (ref 98–110)
Creat: 2.17 mg/dL — ABNORMAL HIGH (ref 0.70–1.30)
Globulin: 3.3 g/dL (calc) (ref 1.9–3.7)
Glucose, Bld: 76 mg/dL (ref 65–99)
Potassium: 5.3 mmol/L (ref 3.5–5.3)
Sodium: 136 mmol/L (ref 135–146)
Total Bilirubin: 0.5 mg/dL (ref 0.2–1.2)
Total Protein: 7.5 g/dL (ref 6.1–8.1)
eGFR: 35 mL/min/{1.73_m2} — ABNORMAL LOW (ref >=60)

## 2022-04-18 LAB — PSA: PSA: 1.77 ng/mL (ref <=4.00)

## 2022-04-18 LAB — LIPID PANEL
Cholesterol: 185 mg/dL (ref <200)
HDL: 50 mg/dL (ref >=40)
LDL Cholesterol (Calc): 115 mg/dL (calc) — ABNORMAL HIGH
Non-HDL Cholesterol (Calc): 135 mg/dL (calc) — ABNORMAL HIGH (ref <130)
Total CHOL/HDL Ratio: 3.7 (calc) (ref <5.0)
Triglycerides: 102 mg/dL (ref <150)

## 2022-04-18 MED ORDER — NA SULFATE-K SULFATE-MG SULF 17.5-3.13-1.6 GM/177ML PO SOLN: 1.0000 | Freq: Once | ORAL | 0 refills | Status: AC

## 2022-04-18 NOTE — Telephone Encounter (Signed)
Gastroenterology Pre-Procedure Review  Request Date: 09/07 Requesting Physician: Dr. Servando Snare  PATIENT REVIEW QUESTIONS: The patient responded to the following health history questions as indicated:    1. Are you having any GI issues? no 2. Do you have a personal history of Polyps? yes (Dr. Servando Snare performed last colonoscopy May 2018) 3. Do you have a family history of Colon Cancer or Polyps? yes (mother colon polyps) 4. Diabetes Mellitus? no 5. Joint replacements in the past 12 months?no 6. Major health problems in the past 3 months?no 7. Any artificial heart valves, MVP, or defibrillator?no    MEDICATIONS & ALLERGIES:    Patient reports the following regarding taking any anticoagulation/antiplatelet therapy:   Plavix, Coumadin, Eliquis, Xarelto, Lovenox, Pradaxa, Brilinta, or Effient? no Aspirin? no  Patient confirms/reports the following medications:  Current Outpatient Medications  Medication Sig Dispense Refill   Cholecalciferol (VITAMIN D) 2000 UNITS tablet Take by mouth.     Cyanocobalamin (VITAMIN B-12 PO) Take by mouth.     finasteride (PROSCAR) 5 MG tablet Take 1 tablet (5 mg total) by mouth daily. 90 tablet 3   MAGNESIUM OXIDE PO Take by mouth.     Omega-3 Fatty Acids (FISH OIL) 1000 MG CAPS Take 1 capsule (1,000 mg total) by mouth 2 (two) times daily. 60 capsule 0   Probiotic Product (PROBIOTIC-10 PO) Take by mouth.     tamsulosin (FLOMAX) 0.4 MG CAPS capsule Take 1 capsule (0.4 mg total) by mouth daily. 90 capsule 3   vitamin C (ASCORBIC ACID) 500 MG tablet Take 500 mg by mouth daily.     Zinc 50 MG CAPS Take 1 capsule by mouth daily before breakfast.     No current facility-administered medications for this visit.    Patient confirms/reports the following allergies:  No Known Allergies  No orders of the defined types were placed in this encounter.   AUTHORIZATION INFORMATION Primary Insurance: 1D#: Group #:  Secondary Insurance: 1D#: Group #:  SCHEDULE  INFORMATION: Date: 05/18/22 Time: Location: [facility]

## 2022-05-08 ENCOUNTER — Other Ambulatory Visit: Payer: Self-pay

## 2022-05-08 ENCOUNTER — Encounter: Payer: Self-pay | Admitting: Gastroenterology

## 2022-05-08 MED ORDER — NA SULFATE-K SULFATE-MG SULF 17.5-3.13-1.6 GM/177ML PO SOLN: 1.0000 | Freq: Once | ORAL | 0 refills | Status: AC

## 2022-05-16 ENCOUNTER — Telehealth: Payer: Self-pay

## 2022-05-16 MED ORDER — GOLYTELY 236 G PO SOLR: 4000.0000 mL | Freq: Once | ORAL | 0 refills | Status: AC

## 2022-05-16 NOTE — Telephone Encounter (Signed)
Patient contacted office to express concern about using SuPrep bowel prep as he has kidney impairment.  Apologized to patient informing him that he is correct that SuPrep is not the best prep for him.  Advised that Golytely prep is the safest for patients with kidney problems.  Patients new rx for Golytely has been sent to pharmacy.  Patient has been advised to mix golytely prep with clear liquid (no red or purple).  The day before procedure starting at 5 pm drink 8 oz every 15-20 mins until half has been completed.  Continue clear liquid diet.  Then 5 hours prior to colonoscopy resume drinking the remainder of the prep.  Do not eat or drink anything 2 hours prior to procedure. Pt verbalized understanding.    Thanks, [place], [place]

## 2022-05-18 ENCOUNTER — Ambulatory Visit: Payer: Managed Care, Other (non HMO) | Admitting: Anesthesiology

## 2022-05-18 ENCOUNTER — Other Ambulatory Visit: Payer: Self-pay

## 2022-05-18 ENCOUNTER — Encounter: Payer: Self-pay | Admitting: Gastroenterology

## 2022-05-18 ENCOUNTER — Encounter
Admission: RE | Disposition: A | Discharge status: Home / Self Care | Payer: Self-pay | Source: Ambulatory Visit | Attending: Gastroenterology

## 2022-05-18 ENCOUNTER — Ambulatory Visit
Admission: RE | Admit: 2022-05-18 | Discharge: 2022-05-18 | Disposition: A | Discharge status: Home / Self Care | Payer: Managed Care, Other (non HMO) | Source: Ambulatory Visit | Attending: Gastroenterology | Admitting: Gastroenterology

## 2022-05-18 DIAGNOSIS — Z1211 Encounter for screening for malignant neoplasm of colon: Secondary | ICD-10-CM | POA: Insufficient documentation

## 2022-05-18 DIAGNOSIS — Z86010 Personal history of colonic polyps: Secondary | ICD-10-CM

## 2022-05-18 DIAGNOSIS — E785 Hyperlipidemia, unspecified: Secondary | ICD-10-CM | POA: Diagnosis not present

## 2022-05-18 DIAGNOSIS — N189 Chronic kidney disease, unspecified: Secondary | ICD-10-CM | POA: Insufficient documentation

## 2022-05-18 DIAGNOSIS — K5730 Diverticulosis of large intestine without perforation or abscess without bleeding: Secondary | ICD-10-CM | POA: Diagnosis not present

## 2022-05-18 DIAGNOSIS — K640 First degree hemorrhoids: Secondary | ICD-10-CM | POA: Diagnosis not present

## 2022-05-18 HISTORY — PX: COLONOSCOPY WITH PROPOFOL: SHX5780

## 2022-05-18 SURGERY — COLONOSCOPY WITH PROPOFOL
Anesthesia: General | Site: Rectum

## 2022-05-18 MED ORDER — STERILE WATER FOR IRRIGATION IR SOLN
Status: DC | PRN
  Administered 2022-05-18: 60 mL

## 2022-05-18 MED ORDER — LIDOCAINE HCL (CARDIAC) PF 100 MG/5ML IV SOSY
PREFILLED_SYRINGE | INTRAVENOUS | Status: DC | PRN
  Administered 2022-05-18: 100 mg via INTRAVENOUS

## 2022-05-18 MED ORDER — LACTATED RINGERS IV SOLN: INTRAVENOUS | Status: DC

## 2022-05-18 MED ORDER — SODIUM CHLORIDE 0.9 % IV SOLN: INTRAVENOUS | Status: DC

## 2022-05-18 MED ORDER — PROPOFOL 10 MG/ML IV BOLUS
INTRAVENOUS | Status: DC | PRN
  Administered 2022-05-18 (×3): 50 mg via INTRAVENOUS
  Administered 2022-05-18 (×2): 25 mg via INTRAVENOUS
  Administered 2022-05-18 (×2): 50 mg via INTRAVENOUS

## 2022-05-18 MED ORDER — STERILE WATER FOR IRRIGATION IR SOLN
Status: DC | PRN
  Administered 2022-05-18: 150 mL

## 2022-05-18 SURGICAL SUPPLY — 21 items

## 2022-05-18 NOTE — H&P (Signed)
Midge Minium, MD [facility] [address], [address] [place], [place] [address] Phone:[phone] Fax : [phone]  Primary Care Physician:  Alba Cory, MD Primary Gastroenterologist:  Dr. Servando Snare  Pre-Procedure History & Physical: HPI:  Mitchell Doyle is a 56 y.o. male is here for an colonoscopy.   Past Medical History:  Diagnosis Date   Bladder distension    Chronic kidney disease Diagnosed dated 2018   History of colonoscopy 2014   Hyperlipidemia    Nodular prostate with lower urinary tract symptoms    Other urinary incontinence    Overweight    Polyp of colon    Premature ejaculation    Vitamin D insufficiency     Past Surgical History:  Procedure Laterality Date   COLONOSCOPY WITH PROPOFOL N/A 02/02/2017   Procedure: COLONOSCOPY WITH PROPOFOL;  Surgeon: Midge Minium, MD;  [facility];  Service: Endoscopy;  Laterality: N/A;   TONSILLECTOMY     transurethral incision of bladder neck Bilateral 12/20/2008    Prior to Admission medications   Medication Sig Start Date End Date Taking? Authorizing Provider  Cholecalciferol (VITAMIN D) 2000 UNITS tablet Take by mouth. 02/24/13  Yes [provider]  Cyanocobalamin (VITAMIN B-12 PO) Take by mouth.   Yes [provider]  finasteride (PROSCAR) 5 MG tablet Take 1 tablet (5 mg total) by mouth daily. 04/17/22  Yes Sowles, Danna Hefty, MD  MAGNESIUM OXIDE PO Take by mouth.   Yes [provider]  Omega-3 Fatty Acids (FISH OIL) 1000 MG CAPS Take 1 capsule (1,000 mg total) by mouth 2 (two) times daily. 03/08/18  Yes Alba Cory, MD  Probiotic Product (PROBIOTIC-10 PO) Take by mouth.   Yes [provider]  tamsulosin (FLOMAX) 0.4 MG CAPS capsule Take 1 capsule (0.4 mg total) by mouth daily. 04/17/22  Yes Sowles, Danna Hefty, MD  vitamin C (ASCORBIC ACID) 500 MG tablet Take 500 mg by mouth daily.   Yes [provider]  Zinc 50 MG CAPS Take 1 capsule by mouth daily before breakfast.   Yes  [provider]    Allergies as of 04/18/2022   (No Known Allergies)    Family History  Problem Relation Age of Onset   Hypertension Mother    Hypertension Father    Prostate cancer Neg Hx    Kidney cancer Neg Hx    Bladder Cancer Neg Hx     Social History   Socioeconomic History   Marital status: Married    Spouse name: Vance Gather   Number of children: 3   Years of education: Not on file   Highest education level: Bachelor's degree (e.g., BA, AB, BS)  Occupational History   Occupation: Furniture conservator/restorer   Tobacco Use   Smoking status: Never   Smokeless tobacco: Never  Vaping Use   Vaping Use: Never used  Substance and Sexual Activity   Alcohol use: No    Alcohol/week: 0.0 standard drinks of alcohol   Drug use: No   Sexual activity: Yes    Partners: Female    Birth control/protection: None  Other Topics Concern   Not on file  Social History Narrative   Patient stated that he worries about his boys (38, 89 & 19)   Social Determinants of Health   Financial Resource Strain: Low Risk  (04/17/2022)   Overall Financial Resource Strain (CARDIA)    Difficulty of Paying Living Expenses: Not hard at all  Food Insecurity: No Food Insecurity (04/17/2022)   Hunger Vital Sign  Worried About Programme researcher, broadcasting/film/video in the Last Year: Never true    Ran Out of Food in the Last Year: Never true  Transportation Needs: No Transportation Needs (04/17/2022)   PRAPARE - Administrator, Civil Service (Medical): No    Lack of Transportation (Non-Medical): No  Physical Activity: Insufficiently Active (04/17/2022)   Exercise Vital Sign    Days of Exercise per Week: 3 days    Minutes of Exercise per Session: 30 min  Stress: No Stress Concern Present (04/17/2022)   [employer] of Occupational Health - Occupational Stress Questionnaire    Feeling of Stress : Not at all  Social Connections: Moderately Isolated (04/17/2022)   Social Connection and Isolation Panel [NHANES]     Frequency of Communication with Friends and Family: Three times a week    Frequency of Social Gatherings with Friends and Family: Once a week    Attends Religious Services: Never    Database administrator or Organizations: No    Attends Banker Meetings: Never    Marital Status: Married  Catering manager Violence: Not At Risk (04/17/2022)   Humiliation, Afraid, Rape, and Kick questionnaire    Fear of Current or Ex-Partner: No    Emotionally Abused: No    Physically Abused: No    Sexually Abused: No    Review of Systems: See HPI, otherwise negative ROS  Physical Exam: BP (!) 143/76   Pulse 76   Temp 98.4 F (36.9 C) (Temporal)   Ht 5\' 11"  (1.803 m)   Wt 79.1 kg   SpO2 100%   BMI 24.31 kg/m  General:   Alert,  pleasant and cooperative in NAD Head:  Normocephalic and atraumatic. Neck:  Supple; no masses or thyromegaly. Lungs:  Clear throughout to auscultation.    Heart:  Regular rate and rhythm. Abdomen:  Soft, nontender and nondistended. Normal bowel sounds, without guarding, and without rebound.   Neurologic:  Alert and  oriented x4;  grossly normal neurologically.  Impression/Plan: is here for an colonoscopy to be performed for a history of adenomatous polyps on 2018   Risks, benefits, limitations, and alternatives regarding  colonoscopy have been reviewed with the patient.  Questions have been answered.  All parties agreeable.   2019, MD  05/18/2022, 7:41 AM

## 2022-05-18 NOTE — Anesthesia Postprocedure Evaluation (Signed)
Anesthesia Post Note  Patient: Mitchell Doyle  Procedure(s) Performed: COLONOSCOPY WITH PROPOFOL (Rectum)     Patient location during evaluation: PACU Anesthesia Type: General Level of consciousness: awake and alert Pain management: pain level controlled Vital Signs Assessment: post-procedure vital signs reviewed and stable Respiratory status: spontaneous breathing, nonlabored ventilation, respiratory function stable and patient connected to nasal cannula oxygen Cardiovascular status: blood pressure returned to baseline and stable Postop Assessment: no apparent nausea or vomiting Anesthetic complications: no   There were no known notable events for this encounter.  Yevette Edwards

## 2022-05-18 NOTE — Anesthesia Preprocedure Evaluation (Signed)
Anesthesia Evaluation  Patient identified by MRN, date of birth, ID band Patient awake    Reviewed: Allergy & Precautions, H&P , NPO status , Patient's Chart, lab work & pertinent test results, reviewed documented beta blocker date and time   Airway Mallampati: II   Neck ROM: full    Dental  (+) Poor Dentition   Pulmonary neg pulmonary ROS,    Pulmonary exam normal        Cardiovascular negative cardio ROS Normal cardiovascular exam Rhythm:regular Rate:Normal     Neuro/Psych negative neurological ROS  negative psych ROS   GI/Hepatic negative GI ROS, Neg liver ROS,   Endo/Other  negative endocrine ROS  Renal/GU Renal disease  negative genitourinary   Musculoskeletal   Abdominal   Peds  Hematology negative hematology ROS (+)   Anesthesia Other Findings Past Medical History: No date: Bladder distension Diagnosed dated 2018: Chronic kidney disease 2014: History of colonoscopy No date: Hyperlipidemia No date: Nodular prostate with lower urinary tract symptoms No date: Other urinary incontinence No date: Overweight No date: Polyp of colon No date: Premature ejaculation No date: Vitamin D insufficiency Past Surgical History: 02/02/2017: COLONOSCOPY WITH PROPOFOL; N/A     Comment:  Procedure: COLONOSCOPY WITH PROPOFOL;  Surgeon: Midge Minium, MD;  Location[facility];  Service:               Endoscopy;  Laterality: N/A; No date: TONSILLECTOMY 12/20/2008: transurethral incision of bladder neck; Bilateral BMI    Body Mass Index: 24.31 kg/m     Reproductive/Obstetrics negative OB ROS                             Anesthesia Physical Anesthesia Plan  ASA: 3  Anesthesia Plan: General   Post-op Pain Management:    Induction:   PONV Risk Score and Plan:   Airway Management Planned:   Additional Equipment:   Intra-op Plan:   Post-operative Plan:    Informed Consent: I have reviewed the patients History and Physical, chart, labs and discussed the procedure including the risks, benefits and alternatives for the proposed anesthesia with the patient or authorized representative who has indicated his/her understanding and acceptance.     Dental Advisory Given  Plan Discussed with: CRNA  Anesthesia Plan Comments:         Anesthesia Quick Evaluation

## 2022-05-18 NOTE — Transfer of Care (Signed)
Immediate Anesthesia Transfer of Care Note  Patient: Mitchell Doyle  Procedure(s) Performed: COLONOSCOPY WITH PROPOFOL (Rectum)  Patient Location: PACU  Anesthesia Type: General  Level of Consciousness: awake, alert  and patient cooperative  Airway and Oxygen Therapy: Patient Spontanous Breathing and Patient connected to supplemental oxygen  Post-op Assessment: Post-op Vital signs reviewed, Patient's Cardiovascular Status Stable, Respiratory Function Stable, Patent Airway and No signs of Nausea or vomiting  Post-op Vital Signs: Reviewed and stable  Complications: There were no known notable events for this encounter.

## 2022-05-18 NOTE — Op Note (Signed)
[facility] Patient Name: Mitchell Doyle Procedure Date: 05/18/2022 7:15 AM MRN: [id number] Account #: [id number] Date of Birth: 04-18-1966 Admit Type: Outpatient Age: 56 Room: [facility] OR ROOM 01 Gender: Male Note Status: Finalized Instrument Name: [id number] Procedure:             Colonoscopy Indications:           High risk colon cancer surveillance: Personal history                         of colonic polyps Providers:             Midge Minium MD, MD Referring MD:          Onnie Boer. Sowles, MD (Referring MD) Medicines:             Propofol per Anesthesia Complications:         No immediate complications. Procedure:             Pre-Anesthesia Assessment:                        - Prior to the procedure, a History and Physical was                         performed, and patient medications and allergies were                         reviewed. The patient's tolerance of previous                         anesthesia was also reviewed. The risks and benefits                         of the procedure and the sedation options and risks                         were discussed with the patient. All questions were                         answered, and informed consent was obtained. Prior                         Anticoagulants: The patient has taken no previous                         anticoagulant or antiplatelet agents. ASA Grade                         Assessment: II - A patient with mild systemic disease.                         After reviewing the risks and benefits, the patient                         was deemed in satisfactory condition to undergo the                         procedure.  After obtaining informed consent, the colonoscope was                         passed under direct vision. Throughout the procedure,                         the patient's blood pressure, pulse, and oxygen                         saturations were monitored  continuously. The                         Colonoscope was introduced through the anus and                         advanced to the the cecum, identified by appendiceal                         orifice and ileocecal valve. The colonoscopy was                         performed without difficulty. The patient tolerated                         the procedure well. The quality of the bowel                         preparation was excellent. Findings:      The perianal and digital rectal examinations were normal.      Multiple small-mouthed diverticula were found in the sigmoid colon.      Non-bleeding internal hemorrhoids were found during retroflexion. The       hemorrhoids were Grade I (internal hemorrhoids that do not prolapse). Impression:            - Diverticulosis in the sigmoid colon.                        - Non-bleeding internal hemorrhoids.                        - No specimens collected. Recommendation:        - Discharge patient to home.                        - Resume previous diet.                        - Continue present medications. Procedure Code(s):     --- Professional ---                        [id number], Colonoscopy, flexible; diagnostic, including                         collection of specimen(s) by brushing or washing, when                         performed (separate procedure) Diagnosis Code(s):     --- Professional ---  Z86.010, Personal history of colonic polyps CPT copyright 2019 American Medical Association. All rights reserved. The codes documented in this report are preliminary and upon coder review may  be revised to meet current compliance requirements. Lucilla Lame MD, MD 05/18/2022 8:15:01 AM This report has been signed electronically. Number of Addenda: 0 Note Initiated On: 05/18/2022 7:15 AM Scope Withdrawal Time: 0 hours 9 minutes 23 seconds  Total Procedure Duration: 0 hours 14 minutes 49 seconds  Estimated Blood Loss:  Estimated blood  loss: none.      [facility]

## 2022-05-19 ENCOUNTER — Encounter: Payer: Self-pay | Admitting: Gastroenterology

## 2023-02-25 ENCOUNTER — Other Ambulatory Visit: Payer: Self-pay | Admitting: Family Medicine

## 2023-02-25 DIAGNOSIS — N401 Enlarged prostate with lower urinary tract symptoms: Secondary | ICD-10-CM

## 2023-02-25 DIAGNOSIS — N138 Other obstructive and reflux uropathy: Secondary | ICD-10-CM

## 2023-04-20 NOTE — Progress Notes (Unsigned)
Name: Mitchell Doyle   MRN: [id number]    DOB: Feb 05, 1966   Date:04/20/2023       Progress Note  Subjective  Chief Complaint  Annual Exam  HPI  Patient presents for annual CPE.  IPSS Questionnaire (AUA-7): Over the past month.   1)  How often have you had a sensation of not emptying your bladder completely after you finish urinating?  {Rating:19227}  2)  How often have you had to urinate again less than two hours after you finished urinating? {Rating:19227}  3)  How often have you found you stopped and started again several times when you urinated?  {Rating:19227}  4) How difficult have you found it to postpone urination?  {Rating:19227}  5) How often have you had a weak urinary stream?  {Rating:19227}  6) How often have you had to push or strain to begin urination?  {Rating:19227}  7) How many times did you most typically get up to urinate from the time you went to bed until the time you got up in the morning?  {Rating:19228}  Total score:  0-7 mildly symptomatic   8-19 moderately symptomatic   20-35 severely symptomatic     Diet: *** Exercise: *** Last Dental Exam: **** Last Eye Exam: ***  Depression: phq 9 is {gen pos neg:315643}    04/17/2022    8:16 AM 04/14/2021    8:45 AM 09/16/2020    8:51 AM 03/16/2020    8:07 AM 09/16/2019    9:40 AM  Depression screen PHQ 2/9  Decreased Interest 0 0 0 0 0  Down, Depressed, Hopeless 0 0 0 0 0  PHQ - 2 Score 0 0 0 0 0  Altered sleeping 0  0 0 0  Tired, decreased energy 1  0 0 0  Change in appetite 0  0 0 0  Feeling bad or failure about yourself  0  0 0 0  Trouble concentrating 0  0 0 0  Moving slowly or fidgety/restless 0  0 0 0  Suicidal thoughts 0  0 0 0  PHQ-9 Score 1  0 0 0  Difficult doing work/chores Not difficult at all   Not difficult at all     Hypertension:  BP Readings from Last 3 Encounters:  05/18/22 107/72  04/17/22 118/78  04/14/21 (!) 142/90    Obesity: Wt Readings from Last 3 Encounters:  05/18/22 174 lb 4.8  oz (79.1 kg)  04/17/22 170 lb 11.2 oz (77.4 kg)  04/14/21 176 lb (79.8 kg)   BMI Readings from Last 3 Encounters:  05/18/22 24.31 kg/m  04/17/22 23.81 kg/m  04/14/21 24.55 kg/m     Lipids:  Lab Results  Component Value Date   CHOL 185 04/17/2022   CHOL 223 (H) 04/14/2021   CHOL 213 (H) 09/16/2020   Lab Results  Component Value Date   HDL 50 04/17/2022   HDL 73 04/14/2021   HDL 55 09/16/2020   Lab Results  Component Value Date   LDLCALC 115 (H) 04/17/2022   LDLCALC 132 (H) 04/14/2021   LDLCALC 139 (H) 09/16/2020   Lab Results  Component Value Date   TRIG 102 04/17/2022   TRIG 81 04/14/2021   TRIG 91 09/16/2020   Lab Results  Component Value Date   CHOLHDL 3.7 04/17/2022   CHOLHDL 3.1 04/14/2021   CHOLHDL 3.9 09/16/2020   No results found for: "LDLDIRECT" Glucose:  Glucose, Bld  Date Value Ref Range Status  04/17/2022 76 65 - 99 mg/dL  Final    Comment:    .            Fasting reference interval .   08/26/2018 88 65 - 99 mg/dL Final    Comment:    .            Fasting reference interval .   03/08/2018 87 65 - 99 mg/dL Final    Comment:    .            Fasting reference interval .     Flowsheet Row [facility] Visit from 09/16/[facility] in [facility]  AUDIT-C Score 0      Married STD testing and prevention (HIV/chl/gon/syphilis): N/A Sexual history:  Hep C Screening: 09/16/20 Skin cancer: Discussed monitoring for atypical lesions Colorectal cancer: 05/18/22 Prostate cancer:   Lab Results  Component Value Date   PSA 1.77 04/17/2022   PSA 0.32 09/16/2020   PSA 0.9 03/08/2018     Lung cancer:  Low Dose CT Chest recommended if Age 103-80 years, 30 pack-year currently smoking OR have quit w/in 15years. Patient  no a candidate for screening   AAA: The USPSTF recommends one-time screening with ultrasonography in men ages 73 to 75 years who have ever smoked. Patient   no, a candidate for screening  ECG:  03/08/18  Vaccines:    HPV: N/A Tdap: up to date Shingrix: N/A Pneumonia: N/A Flu: 2015 COVID-19: up to date  Advanced Care Planning: A voluntary discussion about advance care planning including the explanation and discussion of advance directives.  Discussed health care proxy and Living will, and the patient was able to identify a health care proxy as ***.  Patient does not have a living will and power of attorney of health care   Patient Active Problem List   Diagnosis Date Noted   Atherosclerosis of aorta (HCC) 08/26/2018   Left renal atrophy 08/26/2018   Hydronephrosis, left 03/08/2018   Secondary hyperparathyroidism of renal origin (HCC) 03/08/2018   Intermittent self-catheterization of bladder 03/08/2018   History of epididymitis 03/08/2018   Chronic kidney disease (CKD), stage III (moderate) (HCC) 03/06/2017   Internal hemorrhoids without complication 03/05/2017   Personal history of colonic polyps    Benign prostatic hyperplasia with urinary obstruction 02/27/2015   History of urinary retention 02/27/2015   Vitamin D deficiency 08/10/2009   Dyslipidemia 02/11/2007    Past Surgical History:  Procedure Laterality Date   COLONOSCOPY WITH PROPOFOL N/A 02/02/2017   Procedure: COLONOSCOPY WITH PROPOFOL;  Surgeon: Midge Minium, MD;  Location: [facility];  Service: Endoscopy;  Laterality: N/A;   COLONOSCOPY WITH PROPOFOL N/A 05/18/2022   Procedure: COLONOSCOPY WITH PROPOFOL;  Surgeon: Midge Minium, MD;  Location: [facility];  Service: Endoscopy;  Laterality: N/A;   TONSILLECTOMY     transurethral incision of bladder neck Bilateral 12/20/2008    Family History  Problem Relation Age of Onset   Hypertension Mother    Hypertension Father    Prostate cancer Neg Hx    Kidney cancer Neg Hx    Bladder Cancer Neg Hx     Social History   Socioeconomic History   Marital status: Married    Spouse name: Vance Gather   Number of children: 3   Years of education: Not on file   Highest  education level: Bachelor's degree (e.g., BA, AB, BS)  Occupational History   Occupation: Furniture conservator/restorer   Tobacco Use   Smoking status: Never   Smokeless tobacco: Never  Vaping  Use   Vaping status: Never Used  Substance and Sexual Activity   Alcohol use: No    Alcohol/week: 0.0 standard drinks of alcohol   Drug use: No   Sexual activity: Yes    Partners: Female    Birth control/protection: None  Other Topics Concern   Not on file  Social History Narrative   Patient stated that he worries about his boys (2, 48 & 48)   Social Determinants of Corporate investment banker Strain: Low Risk  (04/17/2022)   Overall Financial Resource Strain (CARDIA)    Difficulty of Paying Living Expenses: Not hard at all  Food Insecurity: No Food Insecurity (04/17/2022)   Hunger Vital Sign    Worried About Running Out of Food in the Last Year: Never true    Ran Out of Food in the Last Year: Never true  Transportation Needs: No Transportation Needs (04/17/2022)   PRAPARE - Administrator, Civil Service (Medical): No    Lack of Transportation (Non-Medical): No  Physical Activity: Insufficiently Active (04/17/2022)   Exercise Vital Sign    Days of Exercise per Week: 3 days    Minutes of Exercise per Session: 30 min  Stress: No Stress Concern Present (04/17/2022)   [employer] of Occupational Health - Occupational Stress Questionnaire    Feeling of Stress : Not at all  Social Connections: Moderately Isolated (04/17/2022)   Social Connection and Isolation Panel [NHANES]    Frequency of Communication with Friends and Family: Three times a week    Frequency of Social Gatherings with Friends and Family: Once a week    Attends Religious Services: Never    Database administrator or Organizations: No    Attends Banker Meetings: Never    Marital Status: Married  Catering manager Violence: Not At Risk (04/17/2022)   Humiliation, Afraid, Rape, and Kick questionnaire    Fear of Current  or Ex-Partner: No    Emotionally Abused: No    Physically Abused: No    Sexually Abused: No     Current Outpatient Medications:    Cholecalciferol (VITAMIN D) 2000 UNITS tablet, Take by mouth., Disp: , Rfl:    Cyanocobalamin (VITAMIN B-12 PO), Take by mouth., Disp: , Rfl:    finasteride (PROSCAR) 5 MG tablet, TAKE 1 TABLET DAILY, Disp: 90 tablet, Rfl: 0   MAGNESIUM OXIDE PO, Take by mouth., Disp: , Rfl:    Omega-3 Fatty Acids (FISH OIL) 1000 MG CAPS, Take 1 capsule (1,000 mg total) by mouth 2 (two) times daily., Disp: 60 capsule, Rfl: 0   Probiotic Product (PROBIOTIC-10 PO), Take by mouth., Disp: , Rfl:    tamsulosin (FLOMAX) 0.4 MG CAPS capsule, TAKE 1 CAPSULE DAILY, Disp: 90 capsule, Rfl: 0   vitamin C (ASCORBIC ACID) 500 MG tablet, Take 500 mg by mouth daily., Disp: , Rfl:    Zinc 50 MG CAPS, Take 1 capsule by mouth daily before breakfast., Disp: , Rfl:   No Known Allergies   ROS  ***   Objective  There were no vitals filed for this visit.  There is no height or weight on file to calculate BMI.  Physical Exam ***  No results found for this or any previous visit (from the past 2160 hour(s)).   Fall Risk:    04/17/2022    8:15 AM 04/14/2021    8:45 AM 09/16/2020    8:51 AM 03/16/2020    8:07 AM 09/16/2019    9:39 AM  Fall Risk   Falls in the past year? 0 0 0 0 0  Number falls in past yr:  0 0 0 0  Injury with Fall?  0 0 0 0  Risk for fall due to : No Fall Risks      Follow up Falls prevention discussed;Education provided;Falls evaluation completed   Falls evaluation completed      Functional Status Survey:      Assessment & Plan  1. Well adult exam ***    -Prostate cancer screening and PSA options (with potential risks and benefits of testing vs not testing) were discussed along with recent recs/guidelines. -USPSTF grade A and B recommendations reviewed with patient; age-appropriate recommendations, preventive care, screening tests, etc discussed and  encouraged; healthy living encouraged; see AVS for patient education given to patient -Discussed importance of 150 minutes of physical activity weekly, eat two servings of fish weekly, eat one serving of tree nuts ( cashews, pistachios, pecans, almonds.Marland Kitchen) every other day, eat 6 servings of fruit/vegetables daily and drink plenty of water and avoid sweet beverages.  -Reviewed Health Maintenance: yes

## 2023-04-23 ENCOUNTER — Encounter: Payer: Self-pay | Admitting: Family Medicine

## 2023-04-23 ENCOUNTER — Ambulatory Visit (INDEPENDENT_AMBULATORY_CARE_PROVIDER_SITE_OTHER): Payer: Managed Care, Other (non HMO) | Admitting: Family Medicine

## 2023-04-23 VITALS — BP 122/80 | HR 90 | Resp 16 | Ht 69.0 in | Wt 175.0 lb

## 2023-04-23 DIAGNOSIS — Z113 Encounter for screening for infections with a predominantly sexual mode of transmission: Secondary | ICD-10-CM

## 2023-04-23 DIAGNOSIS — Z79899 Other long term (current) drug therapy: Secondary | ICD-10-CM

## 2023-04-23 DIAGNOSIS — K629 Disease of anus and rectum, unspecified: Secondary | ICD-10-CM

## 2023-04-23 DIAGNOSIS — N401 Enlarged prostate with lower urinary tract symptoms: Secondary | ICD-10-CM

## 2023-04-23 DIAGNOSIS — N138 Other obstructive and reflux uropathy: Secondary | ICD-10-CM

## 2023-04-23 DIAGNOSIS — I700 Atherosclerosis of aorta: Secondary | ICD-10-CM

## 2023-04-23 DIAGNOSIS — Z0000 Encounter for general adult medical examination without abnormal findings: Secondary | ICD-10-CM | POA: Diagnosis not present

## 2023-05-01 ENCOUNTER — Encounter: Payer: Self-pay | Admitting: Surgery

## 2023-05-01 ENCOUNTER — Ambulatory Visit (INDEPENDENT_AMBULATORY_CARE_PROVIDER_SITE_OTHER): Payer: 59 | Admitting: Surgery

## 2023-05-01 VITALS — BP 158/88 | HR 89 | Temp 98.6°F | Ht 71.0 in | Wt 174.6 lb

## 2023-05-01 DIAGNOSIS — K629 Disease of anus and rectum, unspecified: Secondary | ICD-10-CM

## 2023-05-01 NOTE — Patient Instructions (Signed)
Advised to pursue a goal of 25 to 30 g of fiber daily.  Made aware that the majority of this may be through natural sources, but advised to be aware of actual consumption and to ensure minimal consumption by daily supplementation.  Various forms of supplements discussed.  Recommended Psyllium husk, that mixes well with applesauce, or the powder which goes down well shaken with chocolate milk.  Strongly advised to consume more fluids to ensure adequate hydration, instructed to watch color of urine to determine adequacy of hydration.  Clarity is pursued in urine output, and bowel activity that correlates to significant meal intake.   We need to avoid deferring having bowel movements, advised to take the time at the first sign of sensation, typically following meals, and in the morning.   Subsequent utilization of MiraLAX may be needed ensure at least daily movement, ideally twice daily bowel movements.  If multiple doses of MiraLAX are necessary utilize them. Never skip a day...  To be regular, we must do the above EVERY day.    If you have any concerns or questions, please feel free to call our office. See follow up appointment.

## 2023-05-02 DIAGNOSIS — K629 Disease of anus and rectum, unspecified: Secondary | ICD-10-CM | POA: Insufficient documentation

## 2023-05-02 NOTE — Progress Notes (Signed)
Patient ID: Mitchell Doyle, male   DOB: 05/06/1966, 57 y.o.   MRN: [id number]  Chief Complaint: Anal lesion  History of Present Illness Mitchell Doyle is a 57 y.o. male with a recently appreciated anal lesion on complete physical examination.  Patient denies any history of anal pain, burning or itching.  No prior history of anal surgery.  He presents with his spouse present.  No known history of bleeding, hemorrhoids, constipation.  He reportedly is a vegetarian that has admitted to some bulky hard stools with a degree of straining but denies any remarkable pain that might lead to avoidance of bowel activity.  Denies any weight loss or malaise. Colonoscopy 9/ 7/ 2023 had unremarkable diverticula, and grade 1 internal hemorrhoids.  Past Medical History Past Medical History:  Diagnosis Date   Bladder distension    Chronic kidney disease Diagnosed dated 2018   History of colonoscopy 2014   Hyperlipidemia    Nodular prostate with lower urinary tract symptoms    Other urinary incontinence    Overweight    Polyp of colon    Premature ejaculation    Vitamin D insufficiency       Past Surgical History:  Procedure Laterality Date   COLONOSCOPY WITH PROPOFOL N/A 02/02/2017   Procedure: COLONOSCOPY WITH PROPOFOL;  Surgeon: Midge Minium, MD;  Location: [facility];  Service: Endoscopy;  Laterality: N/A;   COLONOSCOPY WITH PROPOFOL N/A 05/18/2022   Procedure: COLONOSCOPY WITH PROPOFOL;  Surgeon: Midge Minium, MD;  Location: [facility];  Service: Endoscopy;  Laterality: N/A;   TONSILLECTOMY     transurethral incision of bladder neck Bilateral 12/20/2008    No Known Allergies  Current Outpatient Medications  Medication Sig Dispense Refill   Cholecalciferol (VITAMIN D) 2000 UNITS tablet Take by mouth.     Cyanocobalamin (VITAMIN B-12 PO) Take by mouth.     finasteride (PROSCAR) 5 MG tablet TAKE 1 TABLET DAILY 90 tablet 0   MAGNESIUM OXIDE PO Take by mouth.     Omega-3 Fatty Acids  (FISH OIL) 1000 MG CAPS Take 1 capsule (1,000 mg total) by mouth 2 (two) times daily. 60 capsule 0   Probiotic Product (PROBIOTIC-10 PO) Take by mouth.     tamsulosin (FLOMAX) 0.4 MG CAPS capsule TAKE 1 CAPSULE DAILY 90 capsule 0   vitamin C (ASCORBIC ACID) 500 MG tablet Take 500 mg by mouth daily.     Zinc 50 MG CAPS Take 1 capsule by mouth daily before breakfast.     No current facility-administered medications for this visit.    Family History Family History  Problem Relation Age of Onset   Hypertension Mother    Hypertension Father    Prostate cancer Neg Hx    Kidney cancer Neg Hx    Bladder Cancer Neg Hx       Social History Social History   Tobacco Use   Smoking status: Never   Smokeless tobacco: Never  Vaping Use   Vaping status: Never Used  Substance Use Topics   Alcohol use: No    Alcohol/week: 0.0 standard drinks of alcohol   Drug use: No        Review of Systems  Constitutional: Negative.   HENT: Negative.    Eyes: Negative.   Respiratory: Negative.    Cardiovascular: Negative.   Gastrointestinal: Negative.   Genitourinary: Negative.   Skin: Negative.   Neurological: Negative.   Psychiatric/Behavioral: Negative.       Physical Exam Blood pressure Marland Kitchen)  158/88, pulse 89, temperature 98.6 F (37 C), temperature source Oral, height 5\' 11"  (1.803 m), weight 174 lb 9.6 oz (79.2 kg), SpO2 99%. Last Weight  Most recent update: 05/01/2023  8:49 AM    Weight  79.2 kg (174 lb 9.6 oz)             CONSTITUTIONAL: Well developed, and nourished, appropriately responsive and aware without distress.   EYES: Sclera non-icteric.   EARS, NOSE, MOUTH AND THROAT:  The oropharynx is clear. Oral mucosa is pink and moist.    Hearing is intact to voice.  NECK: Trachea is midline, and there is no jugular venous distension.  LYMPH NODES:  Lymph nodes in the neck are not appreciated. RESPIRATORY:  Lungs are clear, and breath sounds are equal bilaterally.  Normal  respiratory effort without pathologic use of accessory muscles. CARDIOVASCULAR: Heart is regular in rate and rhythm.   Well perfused.  GI: The abdomen is  soft, nontender, and nondistended. There were no palpable masses.  I did not appreciate hepatosplenomegaly.  GU: DRE was completed, at 6:00 there was a fairly limited white macerated ulcer with central granulation tissue so it appears.  This may be consistent with a healing anal fissure, however there was no significant external sentinel tag.  Also this ulcer was somewhat more external to the classic fissure location within the anal canal. MUSCULOSKELETAL:  Symmetrical muscle tone appreciated in all four extremities.    SKIN: Skin turgor is normal. No pathologic skin lesions appreciated.  NEUROLOGIC:  Motor and sensation appear grossly normal.  Cranial nerves are grossly without defect. PSYCH:  Alert and oriented to person, place and time. Affect is appropriate for situation.  Data Reviewed I have personally reviewed what is currently available of the patient's imaging, recent labs and medical records.   Labs:     Latest Ref Rng & Units 08/26/2018    8:45 AM 05/09/2018   12:00 AM 03/08/2018    9:46 AM  CBC  WBC 3.8 - 10.8 Thousand/uL 4.0   4.0   Hemoglobin 13.2 - 17.1 g/dL 40.9  81.1     91.4   Hematocrit 38.5 - 50.0 % 44.8   45.5   Platelets 140 - 400 Thousand/uL 212   245      This result is from an external source.      Latest Ref Rng & Units 04/23/2023    8:45 AM 04/17/2022    8:45 AM 09/16/2020    9:46 AM  CMP  Glucose 65 - 99 mg/dL  76    BUN 7 - 25 mg/dL  14    Creatinine 7.82 - 1.30 mg/dL  9.56    Sodium 213 - 086 mmol/L  136    Potassium 3.5 - 5.3 mmol/L  5.3    Chloride 98 - 110 mmol/L  100    CO2 20 - 32 mmol/L  26    Calcium 8.6 - 10.3 mg/dL  9.8    Total Protein 6.1 - 8.1 g/dL 7.2  7.5  7.3   Total Bilirubin 0.2 - 1.2 mg/dL 0.7  0.5  1.1   AST 10 - 35 U/L 16  15  15    ALT 9 - 46 U/L 10  13  11       Imaging: Radiological images reviewed:   Within last 24 hrs: No results found.  Assessment    Anal lesion of uncertain etiology, suspicious appearance consistent with anal fissure, however not symptomatic. Patient Active Problem List  Diagnosis Date Noted   Atherosclerosis of aorta (HCC) 08/26/2018   Left renal atrophy 08/26/2018   Hydronephrosis, left 03/08/2018   Secondary hyperparathyroidism of renal origin (HCC) 03/08/2018   Intermittent self-catheterization of bladder 03/08/2018   History of epididymitis 03/08/2018   Chronic kidney disease (CKD), stage III (moderate) (HCC) 03/06/2017   Internal hemorrhoids without complication 03/05/2017   Personal history of colonic polyps    Benign prostatic hyperplasia with urinary obstruction 02/27/2015   History of urinary retention 02/27/2015   Vitamin D deficiency 08/10/2009   Dyslipidemia 02/11/2007    Plan    Will trial conservative measures for anal fissure treatment including supplementation of fiber:  Advised to pursue a goal of 25 to 30 g of fiber daily.  Made aware that the majority of this may be through natural sources, but advised to be aware of actual consumption and to ensure minimal consumption by daily supplementation.  Various forms of supplements discussed.  Recommended Psyllium husk, that mixes well with applesauce, or the powder which goes down well shaken with chocolate milk.  Strongly advised to consume more fluids to ensure adequate hydration, instructed to watch color of urine to determine adequacy of hydration.  Clarity is pursued in urine output, and bowel activity that correlates to significant meal intake.   We need to avoid deferring having bowel movements, advised to take the time at the first sign of sensation, typically following meals, and in the morning.   Subsequent utilization of MiraLAX may be needed ensure at least daily movement, ideally twice daily bowel movements.  If multiple doses of MiraLAX  are necessary utilize them. Never skip a day...  To be regular, we must do the above EVERY day.   Will reevaluate in 3 to 4 weeks, and if this lesion appears the same, we will clearly proceed with excisional biopsy under anesthesia.  This sounds like it would be the patient's preference and as long as were going to have general anesthesia believe would be amenable to excision rather than punch biopsy.  At present he accepts the risk that his disease may progress due to the uncertain etiology of it.     Face-to-face time spent with the patient and accompanying care providers(if present) was 30 minutes, with more than 50% of the time spent counseling, educating, and coordinating care of the patient.    These notes generated with voice recognition software. I apologize for typographical errors.  Campbell Lerner M.D., FACS 05/02/2023, 2:14 PM

## 2023-05-10 ENCOUNTER — Other Ambulatory Visit: Payer: Self-pay | Admitting: Family Medicine

## 2023-05-10 DIAGNOSIS — N138 Other obstructive and reflux uropathy: Secondary | ICD-10-CM

## 2023-05-31 ENCOUNTER — Ambulatory Visit: Payer: 59 | Admitting: Surgery

## 2023-06-12 ENCOUNTER — Ambulatory Visit (INDEPENDENT_AMBULATORY_CARE_PROVIDER_SITE_OTHER): Payer: 59 | Admitting: Surgery

## 2023-06-12 ENCOUNTER — Encounter: Payer: Self-pay | Admitting: Surgery

## 2023-06-12 VITALS — BP 133/75 | HR 76 | Temp 98.0°F | Ht 71.0 in | Wt 175.0 lb

## 2023-06-12 DIAGNOSIS — K629 Disease of anus and rectum, unspecified: Secondary | ICD-10-CM | POA: Diagnosis not present

## 2023-06-12 NOTE — Patient Instructions (Addendum)
We would like to wait and see how this area does in another month. If at that time it does not appear to be healing well we can discuss taking a sample of this area to be sent out.   You could also use tucks pads or witch hazel on the area to help with drying. We would recommend not using lotion in the area after cleansing.

## 2023-06-12 NOTE — Progress Notes (Signed)
Patient ID: Mitchell Doyle, male   DOB: 03/15/1966, 57 y.o.   MRN: [id number]  Chief Complaint: Anal lesion  History of Present Illness Mitchell Doyle is a 57 y.o. male with a follow-up for a previous visit having recently appreciated an  anal lesion on complete physical examination.  Patient denies any history of anal pain, burning or itching.  No prior history of anal surgery.  He presents with his spouse present.  No known history of bleeding, hemorrhoids, constipation.  He reportedly is a vegetarian that has admitted to some bulky hard stools with a degree of straining but denies any remarkable pain that might lead to avoidance of bowel activity.  Denies any weight loss or malaise. Colonoscopy 9/ 7/ 2023 had unremarkable diverticula, and grade 1 internal hemorrhoids. He reports today he does typically utilize lotion every time he wipes, which may be exacerbating keeping this area moist and inhibiting the overgrowth of epidermis over this asymptomatic ulcer.  His lack of pain/tenderness to this area is the biggest concern.  Past Medical History Past Medical History:  Diagnosis Date   Bladder distension    Chronic kidney disease Diagnosed dated 2018   History of colonoscopy 2014   Hyperlipidemia    Nodular prostate with lower urinary tract symptoms    Other urinary incontinence    Overweight    Polyp of colon    Premature ejaculation    Vitamin D insufficiency       Past Surgical History:  Procedure Laterality Date   COLONOSCOPY WITH PROPOFOL N/A 02/02/2017   Procedure: COLONOSCOPY WITH PROPOFOL;  Surgeon: Midge Minium, MD;  Location: [facility];  Service: Endoscopy;  Laterality: N/A;   COLONOSCOPY WITH PROPOFOL N/A 05/18/2022   Procedure: COLONOSCOPY WITH PROPOFOL;  Surgeon: Midge Minium, MD;  Location: [facility];  Service: Endoscopy;  Laterality: N/A;   TONSILLECTOMY     transurethral incision of bladder neck Bilateral 12/20/2008    No Known Allergies  Current  Outpatient Medications  Medication Sig Dispense Refill   Cholecalciferol (VITAMIN D) 2000 UNITS tablet Take by mouth.     Cyanocobalamin (VITAMIN B-12 PO) Take by mouth.     finasteride (PROSCAR) 5 MG tablet TAKE 1 TABLET DAILY 90 tablet 3   MAGNESIUM OXIDE PO Take by mouth.     Omega-3 Fatty Acids (FISH OIL) 1000 MG CAPS Take 1 capsule (1,000 mg total) by mouth 2 (two) times daily. 60 capsule 0   Probiotic Product (PROBIOTIC-10 PO) Take by mouth.     tamsulosin (FLOMAX) 0.4 MG CAPS capsule TAKE 1 CAPSULE DAILY 90 capsule 3   vitamin C (ASCORBIC ACID) 500 MG tablet Take 500 mg by mouth daily.     Zinc 50 MG CAPS Take 1 capsule by mouth daily before breakfast.     No current facility-administered medications for this visit.    Family History Family History  Problem Relation Age of Onset   Hypertension Mother    Hypertension Father    Prostate cancer Neg Hx    Kidney cancer Neg Hx    Bladder Cancer Neg Hx       Social History Social History   Tobacco Use   Smoking status: Never    Passive exposure: Never   Smokeless tobacco: Never  Vaping Use   Vaping status: Never Used  Substance Use Topics   Alcohol use: No    Alcohol/week: 0.0 standard drinks of alcohol   Drug use: No  Review of Systems  Constitutional: Negative.   HENT: Negative.    Eyes: Negative.   Respiratory: Negative.    Cardiovascular: Negative.   Gastrointestinal: Negative.   Genitourinary: Negative.   Skin: Negative.   Neurological: Negative.   Psychiatric/Behavioral: Negative.       Physical Exam Blood pressure 133/75, pulse 76, temperature 98 F (36.7 C), height 5\' 11"  (1.803 m), weight 175 lb (79.4 kg), SpO2 99%. Last Weight  Most recent update: 06/12/2023  8:28 AM    Weight  79.4 kg (175 lb)             CONSTITUTIONAL: Well developed, and nourished, appropriately responsive and aware without distress.   EYES: Sclera non-icteric.   EARS, NOSE, MOUTH AND THROAT:  The oropharynx  is clear. Oral mucosa is pink and moist.    Hearing is intact to voice.  NECK: Trachea is midline, and there is no jugular venous distension.  LYMPH NODES:  Lymph nodes in the neck are not appreciated. RESPIRATORY:  Lungs are clear, and breath sounds are equal bilaterally.  Normal respiratory effort without pathologic use of accessory muscles. CARDIOVASCULAR: Heart is regular in rate and rhythm.   Well perfused.  GI: The abdomen is  soft, nontender, and nondistended. There were no palpable masses.  I did not appreciate hepatosplenomegaly.  GU: DRE was completed, at 6:00 the previously noted quite limited white macerated raised hypertrophic skin on the edges of a ulcer with central granulation tissue so it appears.  I believe this is smaller than it was previously.  This may be consistent with a healing anal fissure, however there was no significant external sentinel tag.  Also this ulcer was somewhat more external to the classic fissure location within the anal canal. MUSCULOSKELETAL:  Symmetrical muscle tone appreciated in all four extremities.    SKIN: Skin turgor is normal. No pathologic skin lesions appreciated.  NEUROLOGIC:  Motor and sensation appear grossly normal.  Cranial nerves are grossly without defect. PSYCH:  Alert and oriented to person, place and time. Affect is appropriate for situation.  Data Reviewed I have personally reviewed what is currently available of the patient's imaging, recent labs and medical records.   Labs:     Latest Ref Rng & Units 08/26/2018    8:45 AM 05/09/2018   12:00 AM 03/08/2018    9:46 AM  CBC  WBC 3.8 - 10.8 Thousand/uL 4.0   4.0   Hemoglobin 13.2 - 17.1 g/dL 13.2  44.0     10.2   Hematocrit 38.5 - 50.0 % 44.8   45.5   Platelets 140 - 400 Thousand/uL 212   245      This result is from an external source.      Latest Ref Rng & Units 04/23/2023    8:45 AM 04/17/2022    8:45 AM 09/16/2020    9:46 AM  CMP  Glucose 65 - 99 mg/dL  76    BUN 7 - 25  mg/dL  14    Creatinine 7.25 - 1.30 mg/dL  3.66    Sodium 440 - 347 mmol/L  136    Potassium 3.5 - 5.3 mmol/L  5.3    Chloride 98 - 110 mmol/L  100    CO2 20 - 32 mmol/L  26    Calcium 8.6 - 10.3 mg/dL  9.8    Total Protein 6.1 - 8.1 g/dL 7.2  7.5  7.3   Total Bilirubin 0.2 - 1.2 mg/dL 0.7  0.5  1.1   AST 10 - 35 U/L 16  15  15    ALT 9 - 46 U/L 10  13  11      Imaging: Radiological images reviewed:   Within last 24 hrs: No results found.  Assessment    Anal lesion of uncertain etiology, suspicious appearance consistent with anal fissure, however not symptomatic. Patient Active Problem List   Diagnosis Date Noted   Anal lesion 05/02/2023   Atherosclerosis of aorta (HCC) 08/26/2018   Left renal atrophy 08/26/2018   Hydronephrosis, left 03/08/2018   Secondary hyperparathyroidism of renal origin (HCC) 03/08/2018   Intermittent self-catheterization of bladder 03/08/2018   History of epididymitis 03/08/2018   Chronic kidney disease (CKD), stage III (moderate) (HCC) 03/06/2017   Internal hemorrhoids without complication 03/05/2017   Personal history of colonic polyps    Benign prostatic hyperplasia with urinary obstruction 02/27/2015   History of urinary retention 02/27/2015   Vitamin D deficiency 08/10/2009   Dyslipidemia 02/11/2007    Plan    He reminded me today that he does a lot of sitting and driving as so he spends a lot of time in his car.  We discussed avoidance of the typical lotion he utilizes in his wiping regimen.  We discussed potentially utilizing witch hazel to help dry the area.  We also discussed utilizing a bidet to cleanse the area with water and blot dry the area. I would like to defer obtaining a biopsy of this area at the present time and give him another 4 to 6 weeks for potential healing.  Prior plans included what is noted below.  Advised to pursue a goal of 25 to 30 g of fiber daily.  Made aware that the majority of this may be through natural sources,  but advised to be aware of actual consumption and to ensure minimal consumption by daily supplementation.  Various forms of supplements discussed.  Recommended Psyllium husk, that mixes well with applesauce, or the powder which goes down well shaken with chocolate milk.  Strongly advised to consume more fluids to ensure adequate hydration, instructed to watch color of urine to determine adequacy of hydration.  Clarity is pursued in urine output, and bowel activity that correlates to significant meal intake.   We need to avoid deferring having bowel movements, advised to take the time at the first sign of sensation, typically following meals, and in the morning.    At present he accepts the risk that his disease may progress due to the uncertain etiology of it.    Face-to-face time spent with the patient and accompanying care providers(if present) was 30 minutes, with more than 50% of the time spent counseling, educating, and coordinating care of the patient.    These notes generated with voice recognition software. I apologize for typographical errors.  Campbell Lerner M.D., FACS 06/12/2023, 8:48 AM

## 2023-07-19 ENCOUNTER — Ambulatory Visit: Payer: 59 | Admitting: Surgery

## 2023-07-19 ENCOUNTER — Encounter: Payer: Self-pay | Admitting: Surgery

## 2023-07-19 VITALS — BP 133/89 | HR 76 | Temp 98.0°F | Ht 71.0 in | Wt 172.0 lb

## 2023-07-19 DIAGNOSIS — K629 Disease of anus and rectum, unspecified: Secondary | ICD-10-CM

## 2023-07-19 NOTE — Patient Instructions (Signed)
Follow up here in January. We will send you a letter about this appointment.

## 2023-07-19 NOTE — Progress Notes (Signed)
Patient ID: Mitchell Doyle, male   DOB: November 11, 1965, 57 y.o.   MRN: [id number]  Chief Complaint: Anal lesion  History of Present Illness Mitchell Doyle is a 57 y.o. male with a second follow-up visit for an  anal lesion noted only on a physician's physical examination.  Patient denies any anal pain, burning or itching.  No prior history of anal surgery.  He presents with his spouse present.  No known history of bleeding, hemorrhoids, constipation.  He reportedly is a vegetarian that has admitted to some bulky hard stools with a degree of straining but denies any remarkable pain that might lead to avoidance of bowel activity.  Denies any weight loss or malaise. Colonoscopy 9/ 7/ 2023 had unremarkable diverticula, and grade 1 internal hemorrhoids. He reported he previously utilized lotion every time he wipes, which may be exacerbating keeping this area moist and inhibiting the overgrowth of epidermis over this asymptomatic ulcer.  His lack of pain/tenderness to this area is notable.  Past Medical History Past Medical History:  Diagnosis Date   Bladder distension    Chronic kidney disease Diagnosed dated 2018   History of colonoscopy 2014   Hyperlipidemia    Nodular prostate with lower urinary tract symptoms    Other urinary incontinence    Overweight    Polyp of colon    Premature ejaculation    Vitamin D insufficiency       Past Surgical History:  Procedure Laterality Date   COLONOSCOPY WITH PROPOFOL N/A 02/02/2017   Procedure: COLONOSCOPY WITH PROPOFOL;  Surgeon: Midge Minium, MD;  Location: [facility];  Service: Endoscopy;  Laterality: N/A;   COLONOSCOPY WITH PROPOFOL N/A 05/18/2022   Procedure: COLONOSCOPY WITH PROPOFOL;  Surgeon: Midge Minium, MD;  Location: [facility];  Service: Endoscopy;  Laterality: N/A;   TONSILLECTOMY     transurethral incision of bladder neck Bilateral 12/20/2008    No Known Allergies  Current Outpatient Medications  Medication Sig Dispense  Refill   Cholecalciferol (VITAMIN D) 2000 UNITS tablet Take by mouth.     Cyanocobalamin (VITAMIN B-12 PO) Take by mouth.     finasteride (PROSCAR) 5 MG tablet TAKE 1 TABLET DAILY 90 tablet 3   MAGNESIUM OXIDE PO Take by mouth.     Omega-3 Fatty Acids (FISH OIL) 1000 MG CAPS Take 1 capsule (1,000 mg total) by mouth 2 (two) times daily. 60 capsule 0   Probiotic Product (PROBIOTIC-10 PO) Take by mouth.     tamsulosin (FLOMAX) 0.4 MG CAPS capsule TAKE 1 CAPSULE DAILY 90 capsule 3   vitamin C (ASCORBIC ACID) 500 MG tablet Take 500 mg by mouth daily.     Zinc 50 MG CAPS Take 1 capsule by mouth daily before breakfast.     No current facility-administered medications for this visit.    Family History Family History  Problem Relation Age of Onset   Hypertension Mother    Hypertension Father    Prostate cancer Neg Hx    Kidney cancer Neg Hx    Bladder Cancer Neg Hx       Social History Social History   Tobacco Use   Smoking status: Never    Passive exposure: Never   Smokeless tobacco: Never  Vaping Use   Vaping status: Never Used  Substance Use Topics   Alcohol use: No    Alcohol/week: 0.0 standard drinks of alcohol   Drug use: No        Review of Systems  Constitutional: Negative.  HENT: Negative.    Eyes: Negative.   Respiratory: Negative.    Cardiovascular: Negative.   Gastrointestinal: Negative.   Genitourinary: Negative.   Skin: Negative.   Neurological: Negative.   Psychiatric/Behavioral: Negative.       Physical Exam Blood pressure 133/89, pulse 76, temperature 98 F (36.7 C), height 5\' 11"  (1.803 m), weight 172 lb (78 kg), SpO2 97%. Last Weight  Most recent update: 07/19/2023  8:35 AM    Weight  78 kg (172 lb)             CONSTITUTIONAL: Well developed, and nourished, appropriately responsive and aware without distress.   EYES: Sclera non-icteric.   EARS, NOSE, MOUTH AND THROAT:  The oropharynx is clear. Oral mucosa is pink and moist.    Hearing is  intact to voice.  NECK: Trachea is midline, and there is no jugular venous distension.  LYMPH NODES:  Lymph nodes in the neck are not appreciated. RESPIRATORY:  Lungs are clear, and breath sounds are equal bilaterally.  Normal respiratory effort without pathologic use of accessory muscles. CARDIOVASCULAR: Heart is regular in rate and rhythm.   Well perfused.  GI: The abdomen is  soft, nontender, and nondistended. There were no palpable masses.  I did not appreciate hepatosplenomegaly.  GU: DRE was completed, at 6:00 the previously noted quite limited white macerated raised hypertrophic skin on the edges of a ulcer with central granulation tissue has diminished with time.  The white edges are soft, no longer thickened.  I believe this is yet smaller than it was before.  This may be consistent with a healing anal fissure/sinus, there has never been an external sentinel tag.  Also this ulcer was somewhat more external to the classic fissure location within the anal canal.    Considering this may be a healing sinus tract with some hypertrophic granulation tissue within it, I utilized a silver nitrate stick and cauterized the granular appearing blood in the central aspect of the small macerated skin lesion. This was well-tolerated.  On gentle exploration with the silver nitrate stick probe, I could not identify any depth, or anything that might indicate this is the opening of a fistula.  Therefore I suspect this might be a very superficial sinus tract that is gradually healing.  There is no evidence of it being neoplastic in its nature.  And I will continue to defer biopsying it.  MUSCULOSKELETAL:  Symmetrical muscle tone appreciated in all four extremities.    SKIN: Skin turgor is normal. No pathologic skin lesions appreciated.  NEUROLOGIC:  Motor and sensation appear grossly normal.  Cranial nerves are grossly without defect. PSYCH:  Alert and oriented to person, place and time. Affect is appropriate for  situation.  Data Reviewed I have personally reviewed what is currently available of the patient's imaging, recent labs and medical records.   Labs:     Latest Ref Rng & Units 08/26/2018    8:45 AM 05/09/2018   12:00 AM 03/08/2018    9:46 AM  CBC  WBC 3.8 - 10.8 Thousand/uL 4.0   4.0   Hemoglobin 13.2 - 17.1 g/dL 78.2  95.6     21.3   Hematocrit 38.5 - 50.0 % 44.8   45.5   Platelets 140 - 400 Thousand/uL 212   245      This result is from an external source.      Latest Ref Rng & Units 04/23/2023    8:45 AM 04/17/2022    8:45 AM  09/16/2020    9:46 AM  CMP  Glucose 65 - 99 mg/dL  76    BUN 7 - 25 mg/dL  14    Creatinine 0.10 - 1.30 mg/dL  2.72    Sodium 536 - 644 mmol/L  136    Potassium 3.5 - 5.3 mmol/L  5.3    Chloride 98 - 110 mmol/L  100    CO2 20 - 32 mmol/L  26    Calcium 8.6 - 10.3 mg/dL  9.8    Total Protein 6.1 - 8.1 g/dL 7.2  7.5  7.3   Total Bilirubin 0.2 - 1.2 mg/dL 0.7  0.5  1.1   AST 10 - 35 U/L 16  15  15    ALT 9 - 46 U/L 10  13  11      Imaging: Radiological images reviewed:   Within last 24 hrs: No results found.  Assessment    Anal lesion of suspected benign/inflammatory etiology, suspicious appearance consistent with anal fistula, however not symptomatic. Patient Active Problem List   Diagnosis Date Noted   Anal lesion 05/02/2023   Atherosclerosis of aorta (HCC) 08/26/2018   Left renal atrophy 08/26/2018   Hydronephrosis, left 03/08/2018   Secondary hyperparathyroidism of renal origin (HCC) 03/08/2018   Intermittent self-catheterization of bladder 03/08/2018   History of epididymitis 03/08/2018   Chronic kidney disease (CKD), stage III (moderate) (HCC) 03/06/2017   Internal hemorrhoids without complication 03/05/2017   History of colonic polyps    Benign prostatic hyperplasia with urinary obstruction 02/27/2015   History of urinary retention 02/27/2015   Vitamin D deficiency 08/10/2009   Dyslipidemia 02/11/2007    Plan    Overall the  appearance of this lesion today is quite reassuring.  We discussed the possibility of his own application of silver nitrate stick to the area, and how difficult that might be.  Increased frequency of application may be well worthwhile, however as of now we will reach evaluate the site in 2 months and see what progress it has made.  Face-to-face time spent with the patient and accompanying care providers(if present) was 30 minutes, with more than 50% of the time spent counseling, educating, and coordinating care of the patient.    These notes generated with voice recognition software. I apologize for typographical errors.  Campbell Lerner M.D., FACS 07/19/2023, 9:19 AM

## 2023-08-23 NOTE — Progress Notes (Signed)
 Follow Up Visit   Patient Name: Mitchell Doyle, male   Patient DOB: 12-14-1965 Date of Service: 08/23/2023  Patient MRN: [id number] Provider Creating Note: Bonnell Sherry, MD  [phone] Primary Care Physician:   [address] [place] [address] Additional Physicians/ Providers:    History of Present Illness Mitchell Doyle is a 57 y.o. male who is following up today for chronic kidney disease stage IIIb, proteinuria, hydronephrosis, and pure hypercholesterolemia.  The patient's chronic kidney disease appears to be stable most recent EGFR 35 with a micro creatinine ratio 14.  In regards to hydronephrosis patient continues to perform self intermittent catheterization 3-4 times per day.  He also has history of hyperlipidemia with total cholesterol 210, triglycerides 161, HDL 58, and LDL of 124.  Overall he is feeling quite well.  He denies nausea, vomiting, dysgeusia.  Medications   Current Outpatient Medications:  .  finasteride  (PROSCAR ) 5 MG tablet, Comments:   Filled Date: Oct 23 2017 12:00AM  Duration: 90, Disp: , Rfl:  .  tamsulosin  (FLOMAX ) 0.4 MG 24 hr capsule, Comments:   Filled Date: Oct 17 2017 12:00AM  Duration: 90, Disp: , Rfl:    Allergies Patient has no known allergies.  Problem List Patient Active Problem List  Diagnosis  . Stage 3 chronic kidney disease (HCC)     Review of Systems  Constitutional:  Negative for chills and fever.  Respiratory:  Negative for cough and shortness of breath.   Cardiovascular:  Negative for chest pain, palpitations and leg swelling.  Gastrointestinal:  Negative for nausea and vomiting.  Genitourinary:  Negative for dysuria, hematuria and urgency.     History Past Medical History:  Diagnosis Date  . Benign prostatic hyperplasia   . Chronic kidney disease, stage 3 unspecified 08/21/2019  . Other and unspecified hyperlipidemia   . Vitamin D  deficiency     Past Surgical History:  Procedure Laterality Date  . OTHER SURGICAL  HISTORY     History reviewed. No pertinent family history. Social History   Tobacco Use  . Smoking status: Never  . Smokeless tobacco: Not on file  Substance Use Topics  . Alcohol use: No        Physical Exam  Vitals BP 140/80 (BP Location: Right upper arm, Patient Position: Sitting)   Pulse 87   Temp 97.9 F   Wt 171 lb (77.6 kg)   SpO2 95%   BMI 23.85 kg/m   PHYSICAL EXAM: General appearance: well developed, well nourished, NAD Eyes: anicteric sclerae, moist conjunctivae; no lid-lag  HENT: Atraumatic; hearing intact Neck: Trachea midline; supple Lungs: CTAB, with normal respiratory effort  CV: S1S2, no murmurs or rubs. Abdomen: Soft, non-tender; bowel sounds present Extremities: No peripheral edema Skin: Warm and dry, normal skin turgor, no rashes noted. Psych: Appropriate affect, alert and oriented to person, place and time    Laboratory Studies  Chemistry  Lab Units 04/24/23 0931 12/21/22 1430 08/14/22 1457 06/08/22 1101 02/09/22 0847 09/27/21 0958  SODIUM mmol/L 137 139 139 137 135 142  POTASSIUM mmol/L 5.2 5.0 4.4 4.8 5.0 4.7  CHLORIDE mmol/L 101 104 103 104 101 106  CO2 mmol/L 28 24 27 28 24 24   CALCIUM  mg/dL 89.6 89.9 9.8 9.7 9.7 89.3*  PHOSPHORUS mg/dL 2.6 3.3 3.3 2.8 2.8  --   PTH pg/mL 60 63 59 63 50  --   GLUCOSE mg/dL 78 76 76 83 90 90  ALBUMIN g/dL 4.6 4.4 4.2 4.3 4.5  --   BUN  mg/dL 19 19 17 12 18 16   CREATININE mg/dL 7.86* 7.92* 8.05* 8.00* 2.23* 1.98*    Iron Studies  Lab Units 06/08/22 1101  IRON mcg/dL 861  TIBC mcg/dL (calc) 680  IRON SATURATION % (calc) 43    CBC  Lab Units 04/24/23 0931 12/21/22 1430 08/14/22 1457 06/08/22 1101 02/09/22 0847  WBC AUTO Thousand/uL 4.0 5.2 4.3 4.4 10.1  HEMOGLOBIN g/dL 84.5 85.5 85.2 86.2 85.3  HEMATOCRIT % 46.2 43.5 44.9 41.7 44.1  MCV fL 83.4 83.7 85.5 83.9 85.6  PLATELETS AUTO Thousand/uL 301 262 204 236 203    Urine  Lab Units 04/24/23 0931 08/14/22 1457 06/08/22 1101  02/09/22 0847  ALB MG/G CREAT UR mg/g creat 14 19 27  38*    Lab Results  Component Value Date   PTH 60 04/24/2023   CALCIUM  10.3 04/24/2023   PHOS 2.6 04/24/2023     Imaging and Other Studies     Orders Placed This Encounter  . CBC and Differential  . Renal Function Panel  . PTH, Intact  . Urine Albumin / Creatinine Ratio  . Lipid panel  . Vitamin D  25 hydroxy       Impression/Recommendations  Mitchell Doyle is a 57 y.o. male with past medical history colonic polyps, BPH, history of urinary retention, vitamin D  deficiency, hyperlipidemia was referred for the evaluation management of chronic kidney disease stage III.   1.  Chronic kidney disease stage IIIb/proteinuria.  Patient continues to do quite well.  Most recent EGFR was 35 with a microalbumin creatinine ratio 14.  Patient previously offered ARB and he declined.  Follow-up renal parameters today.  2.  Hydronephrosis.  Patient continues to perform self intermittent catheterization 3-4 times per day.  He was advised to follow-up with urology by his primary care physician but patient declined previously.  3.  Pure hypercholesterolemia.  Total cholesterol was 210, triglycerides 161, HDL 58, and LDL 124 at last check.  Patient continues to perform dietary modification to help control this.  Return in about 4 months (around 12/22/2023).   Munsoor Lateef, MD

## 2023-09-25 ENCOUNTER — Ambulatory Visit: Payer: 59 | Admitting: Surgery

## 2024-04-25 ENCOUNTER — Encounter: Payer: Self-pay | Admitting: Family Medicine

## 2024-04-25 ENCOUNTER — Ambulatory Visit (INDEPENDENT_AMBULATORY_CARE_PROVIDER_SITE_OTHER): Payer: Self-pay | Admitting: Family Medicine

## 2024-04-25 VITALS — BP 138/82 | HR 87 | Resp 16 | Ht 70.25 in | Wt 173.6 lb

## 2024-04-25 DIAGNOSIS — I700 Atherosclerosis of aorta: Secondary | ICD-10-CM | POA: Diagnosis not present

## 2024-04-25 DIAGNOSIS — Z0001 Encounter for general adult medical examination with abnormal findings: Secondary | ICD-10-CM | POA: Diagnosis not present

## 2024-04-25 DIAGNOSIS — K629 Disease of anus and rectum, unspecified: Secondary | ICD-10-CM

## 2024-04-25 DIAGNOSIS — Z79899 Other long term (current) drug therapy: Secondary | ICD-10-CM

## 2024-04-25 DIAGNOSIS — N1832 Chronic kidney disease, stage 3b: Secondary | ICD-10-CM | POA: Diagnosis not present

## 2024-04-25 DIAGNOSIS — N401 Enlarged prostate with lower urinary tract symptoms: Secondary | ICD-10-CM

## 2024-04-25 DIAGNOSIS — N2581 Secondary hyperparathyroidism of renal origin: Secondary | ICD-10-CM | POA: Diagnosis not present

## 2024-04-25 DIAGNOSIS — N138 Other obstructive and reflux uropathy: Secondary | ICD-10-CM

## 2024-04-25 DIAGNOSIS — Z0000 Encounter for general adult medical examination without abnormal findings: Secondary | ICD-10-CM

## 2024-04-25 DIAGNOSIS — Z23 Encounter for immunization: Secondary | ICD-10-CM | POA: Diagnosis not present

## 2024-04-25 NOTE — Patient Instructions (Signed)

## 2024-04-25 NOTE — Progress Notes (Signed)
 Name: Mitchell Doyle   MRN: [id number]    DOB: [birth year]   Date:04/25/2024       Progress Note  Subjective  Chief Complaint  Chief Complaint  Patient presents with   Annual Exam    HPI  Patient presents for annual CPE and follow up.  BPH: he has a long history of BPH, urinary retention with Acute Kidney injury back in 2016 and seen by [facility] at the time. He had transurethral resection of bladder neck PR cystourethroscopy , he had to be placed on Foley after another urinary retention and since about 2020 doing self cath 3 to 4 times per day  CKI stage 3 b and secondary hyperparathyroidism, under the care of the care of Dr. Marcelino . He prefers not taking medications, he is on plant based diet and drinks water . Discussed ARB and SGL2 agonist today with patient, GFR between 35-39 lately  Elevated bp, patient states bp fluctuates and sometimes in the teens range, he has some white coat hypertension  Atherosclerosis of Aorta: he prefers not taking statins and to continue plant based diet and regular activity   Anal lesion, noticed during exam last year, seen by surgeon, treated with silver nitrate , lost to follow up    IPSS     Row Name 04/25/24 0753         International Prostate Symptom Score   How often have you had the sensation of not emptying your bladder? Not at All     How often have you had to urinate less than every two hours? Not at All     How often have you found you stopped and started again several times when you urinated? Not at All     How often have you found it difficult to postpone urination? Not at All     How often have you had a weak urinary stream? Not at All     How often have you had to strain to start urination? Not at All     How many times did you typically get up at night to urinate? 1 Time     Total IPSS Score 1       Quality of Life due to urinary symptoms   If you were to spend the rest of your life with your urinary condition just the way it is now  how would you feel about that? Mostly Satisfied       N/A has to self cath   Diet: plant based  Exercise: walks 3 times a week with his wife Last Dental Exam:  up to date Last Eye Exam: needs to schedule it  Depression: phq 9 is negative    04/25/2024    7:52 AM 04/23/2023    8:03 AM 04/17/2022    8:16 AM 04/14/2021    8:45 AM 09/16/2020    8:51 AM  Depression screen PHQ 2/9  Decreased Interest 0 0 0 0 0  Down, Depressed, Hopeless 0 0 0 0 0  PHQ - 2 Score 0 0 0 0 0  Altered sleeping  0 0  0  Tired, decreased energy  0 1  0  Change in appetite  0 0  0  Feeling bad or failure about yourself   0 0  0  Trouble concentrating  0 0  0  Moving slowly or fidgety/restless  0 0  0  Suicidal thoughts  0 0  0  PHQ-9 Score  0 1  0  Difficult doing work/chores   Not difficult at all      Hypertension:  BP Readings from Last 3 Encounters:  04/25/24 138/82  07/19/23 133/89  06/12/23 133/75    Obesity: Wt Readings from Last 3 Encounters:  04/25/24 173 lb 9.6 oz (78.7 kg)  07/19/23 172 lb (78 kg)  06/12/23 175 lb (79.4 kg)   BMI Readings from Last 3 Encounters:  04/25/24 24.73 kg/m  07/19/23 23.99 kg/m  06/12/23 24.41 kg/m     [employer] Visit from 04/25/2024 in [facility]  AUDIT-C Score 0     Married STD testing and prevention (HIV/chl/gon/syphilis):  yes Sexual history: no problems  Hep C Screening: completed Skin cancer: Discussed monitoring for atypical lesions Colorectal cancer: up to date  Prostate cancer:  yes Lab Results  Component Value Date   PSA 0.32 04/23/2023   PSA 1.77 04/17/2022   PSA 0.32 09/16/2020     Lung cancer:  Low Dose CT Chest recommended if Age 61-80 years, 30 pack-year currently smoking OR have quit w/in 15years. Patient  is not a candidate for screening   AAA: The USPSTF recommends one-time screening with ultrasonography in men ages 53 to 75 years who have ever smoked. Patient   is not a candidate for  screening  ECG: discussed with patient - to return to have it done   Vaccines: reviewed with the patient.   Advanced Care Planning: A voluntary discussion about advance care planning including the explanation and discussion of advance directives.  Discussed health care proxy and Living will, and the patient was able to identify a health care proxy as wife .  Patient does not have a living will and power of attorney of health care   Patient Active Problem List   Diagnosis Date Noted   Anal lesion 05/02/2023   Atherosclerosis of aorta (HCC) 08/26/2018   Left renal atrophy 08/26/2018   Hydronephrosis, left 03/08/2018   Secondary hyperparathyroidism of renal origin (HCC) 03/08/2018   Intermittent self-catheterization of bladder 03/08/2018   History of epididymitis 03/08/2018   Chronic kidney disease (CKD), stage III (moderate) (HCC) 03/06/2017   Internal hemorrhoids without complication 03/05/2017   History of colonic polyps    Benign prostatic hyperplasia with urinary obstruction 02/27/2015   History of urinary retention 02/27/2015   Vitamin D  deficiency 08/10/2009   Dyslipidemia 02/11/2007    Past Surgical History:  Procedure Laterality Date   COLONOSCOPY WITH PROPOFOL  N/A 02/02/2017   Procedure: COLONOSCOPY WITH PROPOFOL ;  Surgeon: Jinny Carmine, MD;  Location: [facility];  Service: Endoscopy;  Laterality: N/A;   COLONOSCOPY WITH PROPOFOL  N/A 05/18/2022   Procedure: COLONOSCOPY WITH PROPOFOL ;  Surgeon: Jinny Carmine, MD;  Location: [facility];  Service: Endoscopy;  Laterality: N/A;   TONSILLECTOMY     transurethral incision of bladder neck Bilateral 12/20/2008    Family History  Problem Relation Age of Onset   Hypertension Mother    Hypertension Father    Prostate cancer Neg Hx    Kidney cancer Neg Hx    Bladder Cancer Neg Hx     Social History   Socioeconomic History   Marital status: Married    Spouse name: Ginny   Number of children: 3   Years of  education: Not on file   Highest education level: Bachelor's degree (e.g., BA, AB, BS)  Occupational History   Occupation: Furniture conservator/restorer   Tobacco Use   Smoking status: Never    Passive exposure: Never  Smokeless tobacco: Never  Vaping Use   Vaping status: Never Used  Substance and Sexual Activity   Alcohol use: No    Alcohol/week: 0.0 standard drinks of alcohol   Drug use: No   Sexual activity: Yes    Partners: Female    Birth control/protection: None  Other Topics Concern   Not on file  Social History Narrative   Patient stated that he worries about his boys (77, 40 & 66)   Social Drivers of Corporate investment banker Strain: Low Risk  (04/23/2024)   Overall Financial Resource Strain (CARDIA)    Difficulty of Paying Living Expenses: Not hard at all  Food Insecurity: No Food Insecurity (04/23/2024)   Hunger Vital Sign    Worried About Running Out of Food in the Last Year: Never true    Ran Out of Food in the Last Year: Never true  Transportation Needs: No Transportation Needs (04/23/2024)   PRAPARE - Administrator, Civil Service (Medical): No    Lack of Transportation (Non-Medical): No  Physical Activity: Sufficiently Active (04/23/2024)   Exercise Vital Sign    Days of Exercise per Week: 3 days    Minutes of Exercise per Session: 60 min  Stress: No Stress Concern Present (04/23/2024)   [employer] of Occupational Health - Occupational Stress Questionnaire    Feeling of Stress: Not at all  Social Connections: Moderately Isolated (04/23/2024)   Social Connection and Isolation Panel    Frequency of Communication with Friends and Family: Twice a week    Frequency of Social Gatherings with Friends and Family: Once a week    Attends Religious Services: Never    Database administrator or Organizations: No    Attends Engineer, structural: Not on file    Marital Status: Married  Catering manager Violence: Not At Risk (04/25/2024)   Humiliation,  Afraid, Rape, and Kick questionnaire    Fear of Current or Ex-Partner: No    Emotionally Abused: No    Physically Abused: No    Sexually Abused: No     Current Outpatient Medications:    Cholecalciferol (VITAMIN D ) 2000 UNITS tablet, Take by mouth., Disp: , Rfl:    Cyanocobalamin (VITAMIN B-12 PO), Take by mouth., Disp: , Rfl:    finasteride  (PROSCAR ) 5 MG tablet, TAKE 1 TABLET DAILY, Disp: 90 tablet, Rfl: 3   MAGNESIUM OXIDE PO, Take by mouth., Disp: , Rfl:    Omega-3 Fatty Acids (FISH OIL ) 1000 MG CAPS, Take 1 capsule (1,000 mg total) by mouth 2 (two) times daily., Disp: 60 capsule, Rfl: 0   Probiotic Product (PROBIOTIC-10 PO), Take by mouth., Disp: , Rfl:    tamsulosin  (FLOMAX ) 0.4 MG CAPS capsule, TAKE 1 CAPSULE DAILY, Disp: 90 capsule, Rfl: 3   vitamin C (ASCORBIC ACID) 500 MG tablet, Take 500 mg by mouth daily., Disp: , Rfl:    Zinc 50 MG CAPS, Take 1 capsule by mouth daily before breakfast., Disp: , Rfl:   No Known Allergies   ROS  Constitutional: Negative for fever or weight change.  Respiratory: Negative for cough and shortness of breath.   Cardiovascular: Negative for chest pain or palpitations.  Gastrointestinal: Negative for abdominal pain, no bowel changes.  Musculoskeletal: Negative for gait problem or joint swelling.  Skin: Negative for rash.  Neurological: Negative for dizziness or headache.  No other specific complaints in a complete review of systems (except as listed in HPI above).    Objective  Vitals:   04/25/24 0757  BP: 138/82  Pulse: 87  Resp: 16  SpO2: 99%  Weight: 173 lb 9.6 oz (78.7 kg)  Height: 5' 10.25 (1.784 m)    Body mass index is 24.73 kg/m.  Physical Exam  Constitutional: Patient appears well-developed and well-nourished. No distress.  HENT: Head: Normocephalic and atraumatic. Ears: B TMs ok, no erythema or effusion; Nose: Nose normal. Mouth/Throat: Oropharynx is clear and moist. No oropharyngeal exudate.  Eyes: Conjunctivae and  EOM are normal. Pupils are equal, round, and reactive to light. No scleral icterus.  Neck: Normal range of motion. Neck supple. No JVD present. No thyromegaly present.  Cardiovascular: Normal rate, regular rhythm and normal heart sounds.  No murmur heard. No BLE edema. Pulmonary/Chest: Effort normal and breath sounds normal. No respiratory distress. Abdominal: Soft. Bowel sounds are normal, no distension. There is no tenderness. no masses MALE GENITALIA: atrophic testes, no masses palpated, no hernias, no lesions, no discharge RECTAL: Prostate enlarged in size, soft, still has a lesion / white area with small fissure in the center at 12 o'clock - offered to take a picture but patient preferred to only have notes taken  Musculoskeletal: Normal range of motion, no joint effusions. No gross deformities Neurological: he is alert and oriented to person, place, and time. No cranial nerve deficit. Coordination, balance, strength, speech and gait are normal.  Skin: Skin is warm and dry. No rash noted. No erythema.  Psychiatric: Patient has a normal mood and affect. behavior is normal. Judgment and thought content normal.     Assessment & Plan  1. Well adult exam (Primary)   2. Atherosclerosis of aorta (HCC)  Refused statin therapy  3. Chronic kidney disease, stage 3b (HCC)  Continue follow up with Dr. Lateef  4. Secondary hyperparathyroidism of renal origin (HCC)  Monitored by Dr. Lateef   5. Benign prostatic hyperplasia with urinary obstruction  - PSA  6. Long-term use of high-risk medication  - Hepatic function panel  7. Need for pneumococcal 20-valent conjugate vaccination  - Pneumococcal conjugate vaccine 20-valent (Prevnar 20)   8. Anal lesion  - Ambulatory referral to Gastroenterology - seen by surgeon but lesion still present, he would like a second opinion    -Prostate cancer screening and PSA options (with potential risks and benefits of testing vs not testing) were  discussed along with recent recs/guidelines. -USPSTF grade A and B recommendations reviewed with patient; age-appropriate recommendations, preventive care, screening tests, etc discussed and encouraged; healthy living encouraged; see AVS for patient education given to patient -Discussed importance of 150 minutes of physical activity weekly, eat two servings of fish weekly, eat one serving of tree nuts ( cashews, pistachios, pecans, almonds.SABRA) every other day, eat 6 servings of fruit/vegetables daily and drink plenty of water  and avoid sweet beverages.  -Reviewed Health Maintenance: yes

## 2024-04-26 LAB — HEPATIC FUNCTION PANEL
AG Ratio: 1.5 (calc) (ref 1.0–2.5)
ALT: 13 U/L (ref 9–46)
AST: 14 U/L (ref 10–35)
Albumin: 4.1 g/dL (ref 3.6–5.1)
Alkaline phosphatase (APISO): 74 U/L (ref 35–144)
Bilirubin, Direct: 0.1 mg/dL (ref 0.0–0.2)
Globulin: 2.7 g/dL (ref 1.9–3.7)
Indirect Bilirubin: 0.6 mg/dL (ref 0.2–1.2)
Total Bilirubin: 0.7 mg/dL (ref 0.2–1.2)
Total Protein: 6.8 g/dL (ref 6.1–8.1)

## 2024-04-26 LAB — PSA: PSA: 0.33 ng/mL (ref <=4.00)

## 2024-04-28 ENCOUNTER — Ambulatory Visit: Payer: Self-pay | Admitting: Family Medicine

## 2024-05-18 ENCOUNTER — Other Ambulatory Visit: Payer: Self-pay | Admitting: Family Medicine

## 2024-05-18 DIAGNOSIS — N401 Enlarged prostate with lower urinary tract symptoms: Secondary | ICD-10-CM

## 2024-05-19 NOTE — Telephone Encounter (Signed)
 Pt only be seen for yearly CPE

## 2024-06-10 ENCOUNTER — Other Ambulatory Visit: Payer: Self-pay | Admitting: Family Medicine

## 2024-06-10 ENCOUNTER — Inpatient Hospital Stay
Admission: RE | Admit: 2024-06-10 | Discharge: 2024-06-10 | Disposition: A | Discharge status: Home / Self Care | Payer: Self-pay | Source: Ambulatory Visit | Attending: Neurosurgery | Admitting: Neurosurgery

## 2024-06-10 DIAGNOSIS — Z049 Encounter for examination and observation for unspecified reason: Secondary | ICD-10-CM

## 2024-06-13 ENCOUNTER — Other Ambulatory Visit: Payer: Self-pay | Admitting: Family Medicine

## 2024-06-13 DIAGNOSIS — K629 Disease of anus and rectum, unspecified: Secondary | ICD-10-CM

## 2024-06-17 NOTE — Progress Notes (Signed)
 Referring Physician:  Sowles, Krichna, MD [address] [place],  [place] [address]  Primary Physician:  Glenard Mire, MD  History of Present Illness: 06/23/2024 Mitchell Doyle has a history of BPH, CKD stage 3, hydronephrosis on left with left renal atrophy, dyslipidemia.  4 week history of constant right shoulder pain with radiation to his arm. His right arm pain and numbness improved, but he still has constant right shoulder pain to his elbow with tingling in small/ring finger on right. No left sided symptoms. No injury noted. Pain started when shaving his head. Pain is worse with driving. Some improvement with laying down. No weakness. No previous issues with his neck or shoulder.   Given medrol dose pack and zanaflex on 05/21/24 with some relief.   Tobacco use: Does not smoke.   Bowel/Bladder Dysfunction: none  Conservative measures:  Physical therapy:  has not participated in Multimodal medical therapy including regular antiinflammatories:  Tylenol, Medrol dosepack, Tizanidine, Lidocaine  patch Injections:  no epidural steroid injections  Past Surgery: no spine surgery  Mitchell Doyle has no symptoms of cervical myelopathy.  The symptoms are causing a significant impact on the patient's life.   Review of Systems:  A 10 point review of systems is negative, except for the pertinent positives and negatives detailed in the HPI.  Past Medical History: Past Medical History:  Diagnosis Date   Bladder distension    Chronic kidney disease Diagnosed dated 2018   History of colonoscopy 2014   Hyperlipidemia    Nodular prostate with lower urinary tract symptoms    Other urinary incontinence    Overweight    Polyp of colon    Premature ejaculation    Vitamin D  insufficiency     Past Surgical History: Past Surgical History:  Procedure Laterality Date   COLONOSCOPY WITH PROPOFOL  N/A 02/02/2017   Procedure: COLONOSCOPY WITH PROPOFOL ;  Surgeon: Jinny Carmine, MD;   Location: [facility];  Service: Endoscopy;  Laterality: N/A;   COLONOSCOPY WITH PROPOFOL  N/A 05/18/2022   Procedure: COLONOSCOPY WITH PROPOFOL ;  Surgeon: Jinny Carmine, MD;  Location: [facility];  Service: Endoscopy;  Laterality: N/A;   TONSILLECTOMY     transurethral incision of bladder neck Bilateral 12/20/2008    Allergies: Allergies as of 06/23/2024   (No Known Allergies)    Medications: Outpatient Encounter Medications as of 06/23/2024  Medication Sig   Cholecalciferol (VITAMIN D ) 2000 UNITS tablet Take by mouth.   Cyanocobalamin (VITAMIN B-12 PO) Take by mouth.   finasteride  (PROSCAR ) 5 MG tablet TAKE 1 TABLET DAILY   MAGNESIUM OXIDE PO Take by mouth.   Omega-3 Fatty Acids (FISH OIL ) 1000 MG CAPS Take 1 capsule (1,000 mg total) by mouth 2 (two) times daily.   Probiotic Product (PROBIOTIC-10 PO) Take by mouth.   tamsulosin  (FLOMAX ) 0.4 MG CAPS capsule TAKE 1 CAPSULE DAILY   vitamin C (ASCORBIC ACID) 500 MG tablet Take 500 mg by mouth daily.   Zinc 50 MG CAPS Take 1 capsule by mouth daily before breakfast.   No facility-administered encounter medications on file as of 06/23/2024.    Social History: Social History   Tobacco Use   Smoking status: Never    Passive exposure: Never   Smokeless tobacco: Never  Vaping Use   Vaping status: Never Used  Substance Use Topics   Alcohol use: No    Alcohol/week: 0.0 standard drinks of alcohol   Drug use: No    Family Medical History: Family History  Problem Relation Age of Onset   Hypertension Mother    Hypertension Father    Prostate cancer Neg Hx    Kidney cancer Neg Hx    Bladder Cancer Neg Hx     Physical Examination: Vitals:   06/23/24 1001  BP: 130/86    General: Patient is well developed, well nourished, calm, collected, and in no apparent distress. Attention to examination is appropriate.  Respiratory: Patient is breathing without any difficulty.   NEUROLOGICAL:     Awake, alert,  oriented to person, place, and time.  Speech is clear and fluent. Fund of knowledge is appropriate.   Cranial Nerves: Pupils equal round and reactive to light.  Facial tone is symmetric.    No posterior cervical tenderness. No tenderness in bilateral trapezial region.   Good ROM of both shoulders with no pain.   No abnormal lesions on exposed skin.   Strength: Side Biceps Triceps Deltoid Interossei Grip Wrist Ext. Wrist Flex.  R 5 5 5 5 5 5 5   L 5 5 5 5 5 5 5    Side Iliopsoas Quads Hamstring PF DF EHL  R 5 5 5 5 5 5   L 5 5 5 5 5 5    Reflexes are 2+ and symmetric at the biceps, brachioradialis, patella and achilles.   Hoffman's is absent.  Clonus is not present.   Bilateral upper and lower extremity sensation is intact to light touch.     No pain with IR/ER of both hips.   Gait is normal.     Medical Decision Making  Imaging: Cervical xrays dated 05/21/24:  Mild cervical spondylosis and DDD.   Report not available for above images.    Assessment and Plan: Mitchell Doyle has a 4 week history of constant right shoulder pain with radiation to his arm. His pain has improved, but he still has constant right shoulder pain to his elbow with tingling in small/ring finger on right. No left sided symptoms.   He has known cervical spondylosis and DDD.   Symptoms appear to be cervical mediated. No pain with ROM of right shoulder.   Treatment options discussed with patient and following plan made:   - Continue with HEP on his own.  - Continue on prn zanaflex from other providers. Reviewed dosing and side effects. Discussed this can cause drowsiness.  - Discussed PT for cervical spine. He declines for now.  - Discussed getting cervical MRI and/or EMG if he gets worse.  - Will message him in 2 weeks to check on his progress.   I spent a total of 35 minutes in face-to-face and non-face-to-face activities related to this patient's care today including review of outside records, review of  imaging, review of symptoms, physical exam, discussion of differential diagnosis, discussion of treatment options, and documentation.   Thank you for involving me in the care of this patient.   Glade Boys PA-C Dept. of Neurosurgery

## 2024-06-23 ENCOUNTER — Encounter: Payer: Self-pay | Admitting: Orthopedic Surgery

## 2024-06-23 ENCOUNTER — Ambulatory Visit: Admitting: Orthopedic Surgery

## 2024-06-23 VITALS — BP 130/86 | Ht 71.0 in | Wt 178.8 lb

## 2024-06-23 DIAGNOSIS — M25511 Pain in right shoulder: Secondary | ICD-10-CM | POA: Diagnosis not present

## 2024-06-23 DIAGNOSIS — M5412 Radiculopathy, cervical region: Secondary | ICD-10-CM

## 2024-06-23 DIAGNOSIS — M5030 Other cervical disc degeneration, unspecified cervical region: Secondary | ICD-10-CM

## 2024-06-23 DIAGNOSIS — M47812 Spondylosis without myelopathy or radiculopathy, cervical region: Secondary | ICD-10-CM | POA: Diagnosis not present

## 2024-06-23 NOTE — Patient Instructions (Signed)
 It was so nice to see you today. Thank you so much for coming in.    You have some mild wear and tear in your neck (arthritis) and this is likely what is causing neck and right arm pain.   Continue doing your exercises at home. You can continue with tizanidine to help with muscle spasms. Use only as needed and be careful, this can make you sleepy.   Let me know if you decide you want to go to formal PT and I will order it.   If pain gets worse, we can also consider a cervical MRI.   I will message you in 2 weeks to check on you. Please do not hesitate to call if you have any questions or concerns. You can also message me in MyChart.   Glade Boys PA-C [phone]     The physicians and staff at [facility] at [facility] are committed to providing excellent care. You may receive a survey asking for feedback about your experience at our office. We value you your feedback and appreciate you taking the time to to fill it out. The [facility] leadership team is also available to discuss your experience in person, feel free to contact us  [phone].

## 2024-06-30 ENCOUNTER — Telehealth: Payer: Self-pay | Admitting: Orthopedic Surgery

## 2024-06-30 DIAGNOSIS — M47812 Spondylosis without myelopathy or radiculopathy, cervical region: Secondary | ICD-10-CM

## 2024-06-30 DIAGNOSIS — M5412 Radiculopathy, cervical region: Secondary | ICD-10-CM

## 2024-06-30 DIAGNOSIS — M5030 Other cervical disc degeneration, unspecified cervical region: Secondary | ICD-10-CM

## 2024-06-30 NOTE — Telephone Encounter (Signed)
 I sent orders for PT for cervical spine to [facility] in [place]. They should call him or he can call them at [phone].

## 2024-06-30 NOTE — Addendum Note (Signed)
 Addended by: HILMA HASTINGS on: 06/30/2024 02:40 PM   Modules accepted: Orders

## 2024-06-30 NOTE — Telephone Encounter (Signed)
 He wanted to start PT for cervical spine. Please find out if he has preference on PT facility.   Can do [facility], [facility], [facility], or [facility].   Let me know and I'll send referral.

## 2024-08-11 ENCOUNTER — Ambulatory Visit: Admitting: Surgery

## 2024-09-24 ENCOUNTER — Ambulatory Visit: Admitting: Surgery

## 2024-10-13 ENCOUNTER — Ambulatory Visit: Admitting: Surgery

## 2024-11-03 ENCOUNTER — Ambulatory Visit: Admitting: Surgery

## 2025-05-01 ENCOUNTER — Encounter: Admitting: Family Medicine
